# Patient Record
Sex: Female | Born: 1980 | Race: White | Hispanic: No | Marital: Married | Smoking: Former smoker
Health system: Southern US, Community
[De-identification: ages and names within clinical notes are randomized; demographics above are authoritative.]

## PROBLEM LIST (undated history)

## (undated) DIAGNOSIS — K760 Fatty (change of) liver, not elsewhere classified: Secondary | ICD-10-CM

## (undated) DIAGNOSIS — N200 Calculus of kidney: Secondary | ICD-10-CM

## (undated) DIAGNOSIS — U071 COVID-19: Secondary | ICD-10-CM

## (undated) DIAGNOSIS — K589 Irritable bowel syndrome without diarrhea: Secondary | ICD-10-CM

## (undated) HISTORY — PX: TONSILLECTOMY: SUR1361

## (undated) HISTORY — PX: INCONTINENCE SURGERY: SHX676

## (undated) HISTORY — PX: ABDOMINAL HYSTERECTOMY: SHX81

## (undated) HISTORY — PX: SHOULDER SURGERY: SHX246

## (undated) HISTORY — PX: COLONOSCOPY: SHX174

---

## 2008-08-17 ENCOUNTER — Encounter: Payer: Self-pay | Admitting: Pulmonary Disease

## 2008-08-20 ENCOUNTER — Emergency Department (HOSPITAL_BASED_OUTPATIENT_CLINIC_OR_DEPARTMENT_OTHER): Admission: EM | Admit: 2008-08-20 | Discharge: 2008-08-20 | Payer: Self-pay | Admitting: Emergency Medicine

## 2008-08-21 ENCOUNTER — Ambulatory Visit (HOSPITAL_BASED_OUTPATIENT_CLINIC_OR_DEPARTMENT_OTHER): Admission: RE | Admit: 2008-08-21 | Discharge: 2008-08-21 | Payer: Self-pay | Admitting: Family Medicine

## 2008-08-26 ENCOUNTER — Ambulatory Visit: Payer: Self-pay | Admitting: Pulmonary Disease

## 2008-08-26 DIAGNOSIS — R0602 Shortness of breath: Secondary | ICD-10-CM | POA: Insufficient documentation

## 2008-08-26 DIAGNOSIS — Z9189 Other specified personal risk factors, not elsewhere classified: Secondary | ICD-10-CM | POA: Insufficient documentation

## 2008-08-26 DIAGNOSIS — R079 Chest pain, unspecified: Secondary | ICD-10-CM | POA: Insufficient documentation

## 2011-07-06 LAB — BASIC METABOLIC PANEL
BUN: 14
CO2: 20
Creatinine, Ser: 0.8
Glucose, Bld: 101 — ABNORMAL HIGH
Potassium: 3.5
Sodium: 142

## 2011-07-06 LAB — CBC
MCHC: 34.3
MCV: 85.4
RDW: 11.6
WBC: 7.6

## 2015-07-19 ENCOUNTER — Encounter (HOSPITAL_BASED_OUTPATIENT_CLINIC_OR_DEPARTMENT_OTHER): Payer: Self-pay | Admitting: Emergency Medicine

## 2015-07-19 ENCOUNTER — Emergency Department (HOSPITAL_BASED_OUTPATIENT_CLINIC_OR_DEPARTMENT_OTHER): Payer: BLUE CROSS/BLUE SHIELD

## 2015-07-19 ENCOUNTER — Emergency Department (HOSPITAL_BASED_OUTPATIENT_CLINIC_OR_DEPARTMENT_OTHER)
Admission: EM | Admit: 2015-07-19 | Discharge: 2015-07-19 | Disposition: A | Discharge status: Home / Self Care | Payer: BLUE CROSS/BLUE SHIELD | Attending: Emergency Medicine | Admitting: Emergency Medicine

## 2015-07-19 DIAGNOSIS — R110 Nausea: Secondary | ICD-10-CM | POA: Insufficient documentation

## 2015-07-19 DIAGNOSIS — Z87442 Personal history of urinary calculi: Secondary | ICD-10-CM | POA: Insufficient documentation

## 2015-07-19 DIAGNOSIS — Z3202 Encounter for pregnancy test, result negative: Secondary | ICD-10-CM | POA: Diagnosis not present

## 2015-07-19 DIAGNOSIS — R109 Unspecified abdominal pain: Secondary | ICD-10-CM | POA: Diagnosis not present

## 2015-07-19 DIAGNOSIS — Z90710 Acquired absence of both cervix and uterus: Secondary | ICD-10-CM | POA: Insufficient documentation

## 2015-07-19 DIAGNOSIS — Z87891 Personal history of nicotine dependence: Secondary | ICD-10-CM | POA: Insufficient documentation

## 2015-07-19 DIAGNOSIS — R300 Dysuria: Secondary | ICD-10-CM | POA: Diagnosis not present

## 2015-07-19 HISTORY — DX: Calculus of kidney: N20.0

## 2015-07-19 LAB — BASIC METABOLIC PANEL
ANION GAP: 4 — AB (ref 5–15)
BUN: 13 mg/dL (ref 6–20)
CALCIUM: 8.9 mg/dL (ref 8.9–10.3)
CO2: 27 mmol/L (ref 22–32)
Chloride: 110 mmol/L (ref 101–111)
Creatinine, Ser: 0.93 mg/dL (ref 0.44–1.00)
GLUCOSE: 93 mg/dL (ref 65–99)
Potassium: 3.7 mmol/L (ref 3.5–5.1)
Sodium: 141 mmol/L (ref 135–145)

## 2015-07-19 LAB — CBC WITH DIFFERENTIAL/PLATELET
BASOS ABS: 0 10*3/uL (ref 0.0–0.1)
BASOS PCT: 0 %
EOS ABS: 0.2 10*3/uL (ref 0.0–0.7)
EOS PCT: 2 %
HCT: 39.1 % (ref 36.0–46.0)
Hemoglobin: 13.1 g/dL (ref 12.0–15.0)
Lymphocytes Relative: 42 %
Lymphs Abs: 3.2 10*3/uL (ref 0.7–4.0)
MCH: 28.7 pg (ref 26.0–34.0)
MCHC: 33.5 g/dL (ref 30.0–36.0)
MCV: 85.7 fL (ref 78.0–100.0)
MONO ABS: 0.7 10*3/uL (ref 0.1–1.0)
Monocytes Relative: 9 %
Neutro Abs: 3.6 10*3/uL (ref 1.7–7.7)
Neutrophils Relative %: 47 %
PLATELETS: 213 10*3/uL (ref 150–400)
RBC: 4.56 MIL/uL (ref 3.87–5.11)
RDW: 13.1 % (ref 11.5–15.5)
WBC: 7.6 10*3/uL (ref 4.0–10.5)

## 2015-07-19 LAB — URINALYSIS, ROUTINE W REFLEX MICROSCOPIC
BILIRUBIN URINE: NEGATIVE
GLUCOSE, UA: NEGATIVE mg/dL
HGB URINE DIPSTICK: NEGATIVE
KETONES UR: NEGATIVE mg/dL
LEUKOCYTES UA: NEGATIVE
Nitrite: NEGATIVE
PH: 7 (ref 5.0–8.0)
Protein, ur: NEGATIVE mg/dL
Specific Gravity, Urine: 1.004 — ABNORMAL LOW (ref 1.005–1.030)
Urobilinogen, UA: 0.2 mg/dL (ref 0.0–1.0)

## 2015-07-19 LAB — PREGNANCY, URINE: Preg Test, Ur: NEGATIVE

## 2015-07-19 MED ORDER — IBUPROFEN 800 MG PO TABS: 800.0000 mg | ORAL_TABLET | Freq: Three times a day (TID) | ORAL | Status: DC

## 2015-07-19 MED ORDER — ONDANSETRON HCL 4 MG/2ML IJ SOLN
4.0000 mg | Freq: Once | INTRAMUSCULAR | Status: AC
  Administered 2015-07-19: 4 mg via INTRAVENOUS
  Filled 2015-07-19: qty 2

## 2015-07-19 MED ORDER — HYDROCODONE-ACETAMINOPHEN 5-325 MG PO TABS: 1.0000 | ORAL_TABLET | ORAL | Status: DC | PRN

## 2015-07-19 MED ORDER — SODIUM CHLORIDE 0.9 % IV BOLUS (SEPSIS)
1000.0000 mL | Freq: Once | INTRAVENOUS | Status: AC
  Administered 2015-07-19: 1000 mL via INTRAVENOUS

## 2015-07-19 MED ORDER — DIAZEPAM 5 MG PO TABS
5.0000 mg | ORAL_TABLET | Freq: Once | ORAL | Status: AC
Start: 2015-07-19 — End: 2015-07-19
  Administered 2015-07-19: 5 mg via ORAL
  Filled 2015-07-19: qty 1

## 2015-07-19 MED ORDER — KETOROLAC TROMETHAMINE 30 MG/ML IJ SOLN
30.0000 mg | Freq: Once | INTRAMUSCULAR | Status: AC
  Administered 2015-07-19: 30 mg via INTRAVENOUS
  Filled 2015-07-19: qty 1

## 2015-07-19 MED ORDER — MORPHINE SULFATE (PF) 4 MG/ML IV SOLN
4.0000 mg | Freq: Once | INTRAVENOUS | Status: AC
  Administered 2015-07-19: 4 mg via INTRAVENOUS
  Filled 2015-07-19: qty 1

## 2015-07-19 NOTE — ED Provider Notes (Signed)
CSN: [id number]     Arrival date & time 07/19/15  1548 History  By signing my name below, I, Budd PalmerVanessa Prueter, attest that this documentation has been prepared under the direction and in the presence of Glynn OctaveStephen Casson Catena, MD. Electronically Signed: Budd PalmerVanessa Prueter, ED Scribe. 07/19/2015. 5:10 PM.     Chief Complaint  Patient presents with  . Flank Pain   The history is provided by the patient. No language interpreter was used.   HPI Comments: Kristin Jarvis is a 34 y.o. female former smoker with a PMHx of renal stones (2x) and a PSHx of hysterectoy who presents to the Emergency Department complaining of intermittent, sharp left flank pain radiating down the left leg onset 3 days ago. She reports associated dysuria, loss of appetite, and nausea. She states she has applied a heating pad with no relief. She reports exacerbation with sitting straight up. She states she was able to pass her 2 previous kidney stones. She denies recent back injuries and a PMHx of back problems. Pt denies hematuria, vomiting, fever, and abdominal pain.  Past Medical History  Diagnosis Date  . Renal stone    Past Surgical History  Procedure Laterality Date  . Abdominal hysterectomy    . Tonsillectomy    . Shoulder surgery    . Incontinence surgery     History reviewed. No pertinent family history. Social History  Substance Use Topics  . Smoking status: Former Games developermoker  . Smokeless tobacco: None  . Alcohol Use: Yes   OB History    No data available     Review of Systems A complete 10 system review of systems was obtained and all systems are negative except as noted in the HPI and PMH.   Allergies  Sulfonamide derivatives  Home Medications   Prior to Admission medications   Medication Sig Start Date End Date Taking? Authorizing Provider  HYDROcodone-acetaminophen (NORCO/VICODIN) 5-325 MG tablet Take 1 tablet by mouth every 4 (four) hours as needed. 07/19/15   Glynn OctaveStephen Saleema Weppler, MD  ibuprofen (ADVIL,MOTRIN)  800 MG tablet Take 1 tablet (800 mg total) by mouth 3 (three) times daily. 07/19/15   Glynn OctaveStephen Lowell Makara, MD   BP 98/68 mmHg  Pulse 63  Temp(Src) 98.2 F (36.8 C) (Oral)  Resp 18  Ht 6' (1.829 m)  Wt 197 lb (89.359 kg)  BMI 26.71 kg/m2  SpO2 100%  LMP  (Approximate) Physical Exam  Constitutional: She is oriented to person, place, and time. She appears well-developed and well-nourished. No distress.  HENT:  Head: Normocephalic and atraumatic.  Mouth/Throat: Oropharynx is clear and moist. No oropharyngeal exudate.  Eyes: Conjunctivae and EOM are normal. Pupils are equal, round, and reactive to light.  Neck: Normal range of motion. Neck supple.  No meningismus.  Cardiovascular: Normal rate, regular rhythm, normal heart sounds and intact distal pulses.   No murmur heard. Pulmonary/Chest: Effort normal and breath sounds normal. No respiratory distress.  Abdominal: Soft. There is no tenderness. There is no rebound and no guarding.  Musculoskeletal: Normal range of motion. She exhibits tenderness. She exhibits no edema.  Left CVA  Neurological: She is alert and oriented to person, place, and time. No cranial nerve deficit. She exhibits normal muscle tone. Coordination normal.  No ataxia on finger to nose bilaterally. No pronator drift. 5/5 strength throughout. CN 2-12 intact. Negative Romberg. Equal grip strength. Sensation intact. Gait is normal.   Skin: Skin is warm.  Psychiatric: She has a normal mood and affect. Her behavior is normal.  Nursing note and vitals reviewed.   ED Course  Procedures  DIAGNOSTIC STUDIES: Oxygen Saturation is 100% on RA, normal by my interpretation.    COORDINATION OF CARE: 4:09 PM - Discussed plans to order pain and anti-nausea medication as well as diagnostic studies and imaging. Pt advised of plan for treatment and pt agrees.  5:06 PM - Discussed CT scan results. Pt reports she is still having pain. She denies weakness or numbness in the left leg. She  denies any vaginal discharge or bleeding and denies a PMHx of STDs. Discussed possible muscular spasm. Pt advised of plan for treatment and pt agrees.  Labs Review Labs Reviewed  URINALYSIS, ROUTINE W REFLEX MICROSCOPIC (NOT AT [facility]) - Abnormal; Notable for the following:    Specific Gravity, Urine 1.004 (*)    All other components within normal limits  BASIC METABOLIC PANEL - Abnormal; Notable for the following:    Anion gap 4 (*)    All other components within normal limits  PREGNANCY, URINE  CBC WITH DIFFERENTIAL/PLATELET    Imaging Review Ct Renal Stone Study  07/19/2015  CLINICAL DATA:  Left flank pain for 4 days EXAM: CT ABDOMEN AND PELVIS WITHOUT CONTRAST TECHNIQUE: Multidetector CT imaging of the abdomen and pelvis was performed following the standard protocol without IV contrast. COMPARISON:  06/08/2012 FINDINGS: Lung bases are unremarkable. Sagittal images of the spine are unremarkable. Liver, pancreas, spleen and adrenal glands are unremarkable. Unenhanced kidneys are symmetrical in size. No hydronephrosis or hydroureter. Nonobstructive calculus in lower pole of the left kidney measures 2 mm. No calcified ureteral calculi. Bilateral distal ureter is unremarkable. No calcified calculi are noted within under distended urinary bladder. The uterus is surgically absent Moderate stool noted in right colon and transverse colon. No pericecal inflammation. Normal appendix is noted in axial image 65. There is a low lying cecum. No small bowel obstruction. No ascites or free air. No adenopathy. Moderate stool noted within rectum. The terminal ileum is unremarkable. No destructive bony lesions are noted within pelvis. The ovaries are unremarkable. IMPRESSION: 1. There is left nonobstructive nephrolithiasis. 2. No hydronephrosis or hydroureter. 3. No calcified ureteral calculi. 4. Surgically absent uterus. 5. Low lying cecum.  No pericecal inflammation.  Normal appendix. Electronically Signed   By:  Natasha Mead M.D.   On: 07/19/2015 16:33   I have personally reviewed and evaluated these images and lab results as part of my medical decision-making.   EKG Interpretation None      MDM   Final diagnoses:  Flank pain  L flank pain x 3 days similar to previous kidney stone.  Dysuria and nausea.  No fever.  UA negative.  CT shows no ureteral stones. There are nonobstructing stones in the left kidney. No other acute findings. Appendix is normal. Urinalysis is negative for infection and hematuria.  Informed patient is maybe passed kidney stone but could also be muscular skeletal back pain or spasm. She has intact strength and sensation without evidence of cord compression or cauda equina. No bowel or bladder incontinence.  She declines pelvic exam. We'll treat supportively for musculoskeletal back pain. Consider possible kidney stone as well with remaining ureteral spasm. Tolerating PO and ambulatory. Followup with PCP. Return precautions discussed.     I personally performed the services described in this documentation, which was scribed in my presence. The recorded information has been reviewed and is accurate.   Glynn Octave, MD 07/19/15 [id number]

## 2015-07-19 NOTE — ED Notes (Signed)
Pt givne water and diet coke for PO challenge

## 2015-07-19 NOTE — Discharge Instructions (Signed)
Flank Pain As discussed, you may have passed a kidney stone or have muscular pain in your back. Take the medications and followup with your doctor. Return to the ED if you develop new or worsening symptoms. Flank pain refers to pain that is located on the side of the body between the upper abdomen and the back. The pain may occur over a short period of time (acute) or may be long-term or reoccurring (chronic). It may be mild or severe. Flank pain can be caused by many things. CAUSES  Some of the more common causes of flank pain include:  Muscle strains.   Muscle spasms.   A disease of your spine (vertebral disk disease).   A lung infection (pneumonia).   Fluid around your lungs (pulmonary edema).   A kidney infection.   Kidney stones.   A very painful skin rash caused by the chickenpox virus (shingles).   Gallbladder disease.  HOME CARE INSTRUCTIONS  Home care will depend on the cause of your pain. In general,  Rest as directed by your caregiver.  Drink enough fluids to keep your urine clear or pale yellow.  Only take over-the-counter or prescription medicines as directed by your caregiver. Some medicines may help relieve the pain.  Tell your caregiver about any changes in your pain.  Follow up with your caregiver as directed. SEEK IMMEDIATE MEDICAL CARE IF:   Your pain is not controlled with medicine.   You have new or worsening symptoms.  Your pain increases.   You have abdominal pain.   You have shortness of breath.   You have persistent nausea or vomiting.   You have swelling in your abdomen.   You feel faint or pass out.   You have blood in your urine.  You have a fever or persistent symptoms for more than 2-3 days.  You have a fever and your symptoms suddenly get worse. MAKE SURE YOU:   Understand these instructions.  Will watch your condition.  Will get help right away if you are not doing well or get worse.   This information  is not intended to replace advice given to you by your health care provider. Make sure you discuss any questions you have with your health care provider.   Document Released: 11/10/2005 Document Revised: 06/13/2012 Document Reviewed: 05/03/2012 Elsevier Interactive Patient Education [employer]2016 Elsevier Inc.

## 2015-07-19 NOTE — ED Notes (Signed)
Pt in with L flank pain x 4 days, hx of stones. Dysuria.

## 2015-07-19 NOTE — ED Notes (Signed)
Patient transported to CT and returned 

## 2016-02-10 ENCOUNTER — Encounter (HOSPITAL_BASED_OUTPATIENT_CLINIC_OR_DEPARTMENT_OTHER): Payer: Self-pay | Admitting: Emergency Medicine

## 2016-02-10 ENCOUNTER — Emergency Department (HOSPITAL_BASED_OUTPATIENT_CLINIC_OR_DEPARTMENT_OTHER): Payer: BLUE CROSS/BLUE SHIELD

## 2016-02-10 ENCOUNTER — Emergency Department (HOSPITAL_BASED_OUTPATIENT_CLINIC_OR_DEPARTMENT_OTHER)
Admission: EM | Admit: 2016-02-10 | Discharge: 2016-02-10 | Disposition: A | Discharge status: Home / Self Care | Payer: BLUE CROSS/BLUE SHIELD | Attending: Emergency Medicine | Admitting: Emergency Medicine

## 2016-02-10 DIAGNOSIS — Z87891 Personal history of nicotine dependence: Secondary | ICD-10-CM | POA: Insufficient documentation

## 2016-02-10 DIAGNOSIS — R1084 Generalized abdominal pain: Secondary | ICD-10-CM | POA: Insufficient documentation

## 2016-02-10 DIAGNOSIS — R109 Unspecified abdominal pain: Secondary | ICD-10-CM | POA: Diagnosis present

## 2016-02-10 LAB — COMPREHENSIVE METABOLIC PANEL
ALBUMIN: 4.4 g/dL (ref 3.5–5.0)
ALT: 22 U/L (ref 14–54)
ANION GAP: 3 — AB (ref 5–15)
AST: 17 U/L (ref 15–41)
Alkaline Phosphatase: 49 U/L (ref 38–126)
BILIRUBIN TOTAL: 0.6 mg/dL (ref 0.3–1.2)
BUN: 12 mg/dL (ref 6–20)
CHLORIDE: 111 mmol/L (ref 101–111)
CO2: 22 mmol/L (ref 22–32)
Calcium: 8.7 mg/dL — ABNORMAL LOW (ref 8.9–10.3)
Creatinine, Ser: 0.92 mg/dL (ref 0.44–1.00)
GFR calc Af Amer: >60 mL/min (ref >60)
GFR calc non Af Amer: >60 mL/min (ref >60)
GLUCOSE: 93 mg/dL (ref 65–99)
POTASSIUM: 3.3 mmol/L — AB (ref 3.5–5.1)
SODIUM: 136 mmol/L (ref 135–145)
TOTAL PROTEIN: 6.9 g/dL (ref 6.5–8.1)

## 2016-02-10 LAB — CBC
HEMATOCRIT: 38.5 % (ref 36.0–46.0)
HEMOGLOBIN: 12.8 g/dL (ref 12.0–15.0)
MCH: 28.1 pg (ref 26.0–34.0)
MCHC: 33.2 g/dL (ref 30.0–36.0)
MCV: 84.6 fL (ref 78.0–100.0)
Platelets: 234 10*3/uL (ref 150–400)
RBC: 4.55 MIL/uL (ref 3.87–5.11)
RDW: 13.5 % (ref 11.5–15.5)
WBC: 8.9 10*3/uL (ref 4.0–10.5)

## 2016-02-10 LAB — URINALYSIS, ROUTINE W REFLEX MICROSCOPIC
Bilirubin Urine: NEGATIVE
GLUCOSE, UA: NEGATIVE mg/dL
Hgb urine dipstick: NEGATIVE
KETONES UR: NEGATIVE mg/dL
LEUKOCYTES UA: NEGATIVE
NITRITE: NEGATIVE
PROTEIN: NEGATIVE mg/dL
Specific Gravity, Urine: 1.004 — ABNORMAL LOW (ref 1.005–1.030)
pH: 6.5 (ref 5.0–8.0)

## 2016-02-10 LAB — LIPASE, BLOOD: LIPASE: 37 U/L (ref 11–51)

## 2016-02-10 MED ORDER — IOPAMIDOL (ISOVUE-300) INJECTION 61%
100.0000 mL | Freq: Once | INTRAVENOUS | Status: AC | PRN
  Administered 2016-02-10: 100 mL via INTRAVENOUS

## 2016-02-10 MED ORDER — SODIUM CHLORIDE 0.9 % IV SOLN
Freq: Once | INTRAVENOUS | Status: AC
  Administered 2016-02-10: 16:00:00 via INTRAVENOUS

## 2016-02-10 MED ORDER — ONDANSETRON HCL 4 MG/2ML IJ SOLN
4.0000 mg | Freq: Once | INTRAMUSCULAR | Status: AC
  Administered 2016-02-10: 4 mg via INTRAVENOUS
  Filled 2016-02-10: qty 2

## 2016-02-10 MED ORDER — FENTANYL CITRATE (PF) 100 MCG/2ML IJ SOLN
50.0000 ug | INTRAMUSCULAR | Status: AC | PRN
  Administered 2016-02-10 (×2): 50 ug via INTRAVENOUS
  Filled 2016-02-10 (×2): qty 2

## 2016-02-10 MED ORDER — MORPHINE SULFATE (PF) 4 MG/ML IV SOLN
4.0000 mg | INTRAVENOUS | Status: DC | PRN
  Administered 2016-02-10: 4 mg via INTRAVENOUS
  Filled 2016-02-10: qty 1

## 2016-02-10 MED ORDER — ONDANSETRON HCL 4 MG/2ML IJ SOLN
4.0000 mg | Freq: Once | INTRAMUSCULAR | Status: AC | PRN
  Administered 2016-02-10: 4 mg via INTRAVENOUS
  Filled 2016-02-10: qty 2

## 2016-02-10 NOTE — ED Notes (Signed)
Patient states that she is having generalized abdominal pain on and off since Monday. Went to Urgent care and now she is hurting continuously

## 2016-02-10 NOTE — ED Notes (Signed)
Pt states pain has returned, pain still at umbilicus area. Pain med repeated per orders, comfort measures provided, family member at bedside, pt cont on cont POX with int NBP

## 2016-02-10 NOTE — ED Notes (Signed)
[facility]

## 2016-02-10 NOTE — ED Notes (Signed)
Pt states she has had a hysterectomy, ovaries remain in place, states tubes removed with hysterectomy

## 2016-02-10 NOTE — ED Provider Notes (Signed)
CSN: [id number]     Arrival date & time 02/10/16  1357 History   First MD Initiated Contact with Patient 02/10/16 1522     Chief Complaint  Patient presents with  . Abdominal Pain     (Consider location/radiation/quality/duration/timing/severity/associated sxs/prior Treatment) HPI 35 year old female who presents with abdominal pain. History of kidney stones, TAH, and bladder sling x 2. Ongoing since 4 days ago, initially started as low abdominal cramping. Initially coming and going, but now constant. Comes in waves, and seems "contraction like." Lots of nausea, no vomiting. Tolerating PO intake. Painful bowel movement yesterday. Small bowel movement, which is unusual for her. No dysuria, hematuria, or frequency of urine. Associated with feeling tight in her upper abdomen. No fever, but having chills.    Past Medical History  Diagnosis Date  . Renal stone    Past Surgical History  Procedure Laterality Date  . Abdominal hysterectomy    . Tonsillectomy    . Shoulder surgery    . Incontinence surgery     History reviewed. No pertinent family history. Social History  Substance Use Topics  . Smoking status: Former Games developer  . Smokeless tobacco: None  . Alcohol Use: Yes   OB History    No data available     Review of Systems 10/14 systems reviewed and are negative other than those stated in the HPI    Allergies  Sulfonamide derivatives  Home Medications   Prior to Admission medications   Medication Sig Start Date End Date Taking? Authorizing Provider  HYDROcodone-acetaminophen (NORCO/VICODIN) 5-325 MG tablet Take 1 tablet by mouth every 4 (four) hours as needed. 07/19/15   Glynn Octave, MD  ibuprofen (ADVIL,MOTRIN) 800 MG tablet Take 1 tablet (800 mg total) by mouth 3 (three) times daily. 07/19/15   Glynn Octave, MD   BP 110/71 mmHg  Pulse 72  Temp(Src) 98.2 F (36.8 C) (Oral)  Resp 18  Ht 6' (1.829 m)  Wt 210 lb (95.255 kg)  BMI 28.47 kg/m2  SpO2 100%  LMP   (Approximate) Physical Exam Physical Exam  Nursing note and vitals reviewed. Constitutional: Well developed, well nourished, non-toxic, and in no acute distress Head: Normocephalic and atraumatic.  Mouth/Throat: Oropharynx is clear and moist.  Neck: Normal range of motion. Neck supple.  Cardiovascular: Normal rate and regular rhythm.   Pulmonary/Chest: Effort normal and breath sounds normal.  Abdominal: Soft. Mild distension. No CVA tenderness. There is epigastric and periumbilical tenderness. There is no rebound and no guarding.  Musculoskeletal: Normal range of motion.  Neurological: Alert, no facial droop, fluent speech, moves all extremities symmetrically Skin: Skin is warm and dry.  Psychiatric: Cooperative  ED Course  Procedures (including critical care time) Labs Review Labs Reviewed  URINALYSIS, ROUTINE W REFLEX MICROSCOPIC (NOT AT [facility]) - Abnormal; Notable for the following:    Specific Gravity, Urine 1.004 (*)    All other components within normal limits  COMPREHENSIVE METABOLIC PANEL - Abnormal; Notable for the following:    Potassium 3.3 (*)    Calcium 8.7 (*)    Anion gap 3 (*)    All other components within normal limits  LIPASE, BLOOD  CBC  I-STAT BETA HCG BLOOD, ED ([facility], [facility], [facility] ONLY)    Imaging Review Ct Abdomen Pelvis W Contrast  02/10/2016  CLINICAL DATA:  Periumbilical abdominal pain for 3 days with nausea and constipation, history of bladder sling EXAM: CT ABDOMEN AND PELVIS WITH CONTRAST TECHNIQUE: Multidetector CT imaging of the abdomen and pelvis  was performed using the standard protocol following bolus administration of intravenous contrast. CONTRAST:  100mL ISOVUE-300 IOPAMIDOL (ISOVUE-300) INJECTION 61% COMPARISON:  07/19/2015 FINDINGS: Lower chest:  Visualized portions of the lung bases are clear Hepatobiliary: The liver and gallbladder are normal Pancreas: Normal Spleen: Normal Adrenals/Urinary Tract: Adrenal glands are normal. Left kidney is normal with  no hydronephrosis. 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 4 mm low-attenuation rounded lesion upper pole right kidney too small to characterize but possibly a tiny cyst. No hydronephrosis. Stomach/Bowel: There is a nonobstructive bowel gas pattern. Large bowel is normal. Appendix is normal. Small bowel and stomach are normal. Vascular/Lymphatic: No significant vascular abnormalities. Small nonpathologic mesenteric lymph nodes are present. Caps Reproductive: The uterus appears to be surgically absent. There is a surgical clip in the midline posterior to the bladder which was present previously. Ovaries appear mobile lines. Hypo attenuating 2.7 cm left ovarian oval structure likely a cyst or follicle. Right ovary shows no significant findings. Other: No free fluid Musculoskeletal: No acute musculoskeletal findings. IMPRESSION: No significant or acute findings to account for the patient's symptoms. Indeterminate 4 mm right renal lesion too small to characterize possibly a tiny cyst. 2.7 cm left ovarian lesion likely an ovarian cyst or follicle. Electronically Signed   By: Esperanza Heiraymond  Rubner M.D.   On: 02/10/2016 17:30   I have personally reviewed and evaluated these images and lab results as part of my medical decision-making.   EKG Interpretation None      MDM   Final diagnoses:  Generalized abdominal pain    35 year old female who presents with abdominal pain. She is well-appearing, and in no acute distress. Vital signs within normal limits. Her abdomen is soft and benign. With generalized tenderness to palpation. Basic blood work including CBC, comp and metabolic panel, lipase, and urinalysis are unremarkable. CT abdomen pelvis shows no acute intra-abdominal process. At this time I am not suspecting serious or toxic intra-abdominal process. Given her painful and hard bowel movements yesterday, constipation may be likely etiology of her symptoms. Started on bowel regimen.  Strict return and follow-up instructions are reviewed. She expressed understanding of all discharge instructions, and felt comfortable to plan of care.    Lavera Guiseana Duo Liu, MD 02/10/16 [phone]

## 2016-02-10 NOTE — ED Notes (Signed)
Pt tolerated PO contrast.

## 2016-02-10 NOTE — Discharge Instructions (Signed)
Your CT scan does not show any serious cause of your abdominal pain today in your blood work overall is unremarkable. start taking a bowel regimen, such as MiraLAX once daily.  you may also add Colace. Return for worsening symptoms, including worsening pain, vomiting unable to keep down food or fluids, fevers, or any other symptoms concerning to you.   Abdominal Pain, Adult Many things can cause abdominal pain. Usually, abdominal pain is not caused by a disease and will improve without treatment. It can often be observed and treated at home. Your health care provider will do a physical exam and possibly order blood tests and X-rays to help determine the seriousness of your pain. However, in many cases, more time must pass before a clear cause of the pain can be found. Before that point, your health care provider may not know if you need more testing or further treatment. HOME CARE INSTRUCTIONS Monitor your abdominal pain for any changes. The following actions may help to alleviate any discomfort you are experiencing:  Only take over-the-counter or prescription medicines as directed by your health care provider.  Do not take laxatives unless directed to do so by your health care provider.  Try a clear liquid diet (broth, tea, or water) as directed by your health care provider. Slowly move to a bland diet as tolerated. SEEK MEDICAL CARE IF:  You have unexplained abdominal pain.  You have abdominal pain associated with nausea or diarrhea.  You have pain when you urinate or have a bowel movement.  You experience abdominal pain that wakes you in the night.  You have abdominal pain that is worsened or improved by eating food.  You have abdominal pain that is worsened with eating fatty foods.  You have a fever. SEEK IMMEDIATE MEDICAL CARE IF:  Your pain does not go away within 2 hours.  You keep throwing up (vomiting).  Your pain is felt only in portions of the abdomen, such as the right  side or the left lower portion of the abdomen.  You pass bloody or black tarry stools. MAKE SURE YOU:  Understand these instructions.  Will watch your condition.  Will get help right away if you are not doing well or get worse.   This information is not intended to replace advice given to you by your health care provider. Make sure you discuss any questions you have with your health care provider.   Document Released: 06/29/2005 Document Revised: 06/10/2015 Document Reviewed: 05/29/2013 Elsevier Interactive Patient Education [employer] Else[employer] Inc.

## 2016-04-09 ENCOUNTER — Emergency Department (HOSPITAL_BASED_OUTPATIENT_CLINIC_OR_DEPARTMENT_OTHER)
Admission: EM | Admit: 2016-04-09 | Discharge: 2016-04-09 | Disposition: A | Discharge status: Home / Self Care | Payer: BLUE CROSS/BLUE SHIELD | Attending: Emergency Medicine | Admitting: Emergency Medicine

## 2016-04-09 ENCOUNTER — Encounter (HOSPITAL_BASED_OUTPATIENT_CLINIC_OR_DEPARTMENT_OTHER): Payer: Self-pay | Admitting: Emergency Medicine

## 2016-04-09 ENCOUNTER — Emergency Department (HOSPITAL_BASED_OUTPATIENT_CLINIC_OR_DEPARTMENT_OTHER): Payer: BLUE CROSS/BLUE SHIELD

## 2016-04-09 DIAGNOSIS — R300 Dysuria: Secondary | ICD-10-CM

## 2016-04-09 DIAGNOSIS — R109 Unspecified abdominal pain: Secondary | ICD-10-CM | POA: Diagnosis present

## 2016-04-09 DIAGNOSIS — Z87891 Personal history of nicotine dependence: Secondary | ICD-10-CM | POA: Diagnosis not present

## 2016-04-09 DIAGNOSIS — N200 Calculus of kidney: Secondary | ICD-10-CM | POA: Diagnosis not present

## 2016-04-09 LAB — BASIC METABOLIC PANEL
Anion gap: 8 (ref 5–15)
BUN: 13 mg/dL (ref 6–20)
CHLORIDE: 107 mmol/L (ref 101–111)
CO2: 24 mmol/L (ref 22–32)
Calcium: 8.8 mg/dL — ABNORMAL LOW (ref 8.9–10.3)
Creatinine, Ser: 0.91 mg/dL (ref 0.44–1.00)
GFR calc non Af Amer: >60 mL/min (ref >60)
Glucose, Bld: 85 mg/dL (ref 65–99)
POTASSIUM: 3.6 mmol/L (ref 3.5–5.1)
SODIUM: 139 mmol/L (ref 135–145)

## 2016-04-09 LAB — CBC WITH DIFFERENTIAL/PLATELET
BASOS ABS: 0 10*3/uL (ref 0.0–0.1)
BASOS PCT: 0 %
EOS PCT: 2 %
Eosinophils Absolute: 0.2 10*3/uL (ref 0.0–0.7)
HCT: 39.5 % (ref 36.0–46.0)
Hemoglobin: 13.1 g/dL (ref 12.0–15.0)
Lymphocytes Relative: 9 %
Lymphs Abs: 0.7 10*3/uL (ref 0.7–4.0)
MCH: 27.9 pg (ref 26.0–34.0)
MCHC: 33.2 g/dL (ref 30.0–36.0)
MCV: 84 fL (ref 78.0–100.0)
Monocytes Absolute: 0.4 10*3/uL (ref 0.1–1.0)
Monocytes Relative: 6 %
Neutro Abs: 6.6 10*3/uL (ref 1.7–7.7)
Neutrophils Relative %: 83 %
PLATELETS: 214 10*3/uL (ref 150–400)
RBC: 4.7 MIL/uL (ref 3.87–5.11)
RDW: 14.7 % (ref 11.5–15.5)
WBC: 7.9 10*3/uL (ref 4.0–10.5)

## 2016-04-09 LAB — URINALYSIS, ROUTINE W REFLEX MICROSCOPIC
Bilirubin Urine: NEGATIVE
GLUCOSE, UA: NEGATIVE mg/dL
HGB URINE DIPSTICK: NEGATIVE
KETONES UR: NEGATIVE mg/dL
LEUKOCYTES UA: NEGATIVE
Nitrite: NEGATIVE
PROTEIN: NEGATIVE mg/dL
Specific Gravity, Urine: 1.009 (ref 1.005–1.030)
pH: 8 (ref 5.0–8.0)

## 2016-04-09 MED ORDER — IOPAMIDOL (ISOVUE-300) INJECTION 61%
100.0000 mL | Freq: Once | INTRAVENOUS | Status: AC | PRN
  Administered 2016-04-09: 100 mL via INTRAVENOUS

## 2016-04-09 MED ORDER — HYDROMORPHONE HCL 1 MG/ML IJ SOLN
0.5000 mg | Freq: Once | INTRAMUSCULAR | Status: AC
  Administered 2016-04-09: 0.5 mg via INTRAVENOUS
  Filled 2016-04-09: qty 1

## 2016-04-09 MED ORDER — FENTANYL CITRATE (PF) 100 MCG/2ML IJ SOLN
50.0000 ug | INTRAMUSCULAR | Status: DC | PRN
  Administered 2016-04-09: 50 ug via INTRAVENOUS
  Filled 2016-04-09: qty 2

## 2016-04-09 MED ORDER — HYDROMORPHONE HCL 1 MG/ML IJ SOLN
1.0000 mg | Freq: Once | INTRAMUSCULAR | Status: AC
  Administered 2016-04-09: 1 mg via INTRAVENOUS
  Filled 2016-04-09: qty 1

## 2016-04-09 MED ORDER — ONDANSETRON HCL 4 MG PO TABS: 4.0000 mg | ORAL_TABLET | Freq: Four times a day (QID) | ORAL | Status: DC

## 2016-04-09 MED ORDER — ONDANSETRON HCL 4 MG/2ML IJ SOLN
4.0000 mg | Freq: Once | INTRAMUSCULAR | Status: AC
  Administered 2016-04-09: 4 mg via INTRAVENOUS
  Filled 2016-04-09: qty 2

## 2016-04-09 MED ORDER — KETOROLAC TROMETHAMINE 30 MG/ML IJ SOLN
30.0000 mg | Freq: Once | INTRAMUSCULAR | Status: AC
  Administered 2016-04-09: 30 mg via INTRAVENOUS
  Filled 2016-04-09: qty 1

## 2016-04-09 MED ORDER — HYDROCODONE-ACETAMINOPHEN 5-325 MG PO TABS: 1.0000 | ORAL_TABLET | Freq: Four times a day (QID) | ORAL | Status: DC | PRN

## 2016-04-09 NOTE — ED Provider Notes (Signed)
CSN: [id number]     Arrival date & time 04/09/16  1412 History   First MD Initiated Contact with Patient 04/09/16 1438     Chief Complaint  Patient presents with  . Flank Pain   (Consider location/radiation/quality/duration/timing/severity/associated sxs/prior Treatment) HPI Comments: Patient with history of kidney stones, ovarian cysts, hysterectomy -- presents with complaint of left flank pain starting last night. Pain has been waxing and waning in nature and has been severe at times. Patient has had associated nausea but no vomiting. Patient has had dysuria and was seen at outside urgent care last night and started on Macrobid, although her urine was reportedly normal. Culture is pending. She denies change in her stools or bowel movements. No other treatments prior to arrival. Patient was seen several months ago her gynecologist for left pelvis pain. Patient states this pain is different and is more like previous kidney stone. Previous imaging studies performed here shows intrarenal stones, no ureteral stones.  Patient is a 35 y.o. female presenting with flank pain. The history is provided by the patient and medical records.  Flank Pain Associated symptoms include nausea. Pertinent negatives include no abdominal pain, chest pain, coughing, fever, headaches, myalgias, rash, sore throat or vomiting.    Past Medical History  Diagnosis Date  . Renal stone    Past Surgical History  Procedure Laterality Date  . Abdominal hysterectomy    . Tonsillectomy    . Shoulder surgery    . Incontinence surgery     History reviewed. No pertinent family history. Social History  Substance Use Topics  . Smoking status: Former Games developermoker  . Smokeless tobacco: None  . Alcohol Use: Yes   OB History    No data available     Review of Systems  Constitutional: Negative for fever.  HENT: Negative for rhinorrhea and sore throat.   Eyes: Negative for redness.  Respiratory: Negative for cough.    Cardiovascular: Negative for chest pain.  Gastrointestinal: Positive for nausea. Negative for vomiting, abdominal pain and diarrhea.  Genitourinary: Positive for dysuria and flank pain. Negative for hematuria, vaginal bleeding and vaginal discharge.  Musculoskeletal: Negative for myalgias.  Skin: Negative for rash.  Neurological: Negative for headaches.    Allergies  Sulfonamide derivatives  Home Medications   Prior to Admission medications   Medication Sig Start Date End Date Taking? Authorizing Provider  HYDROcodone-acetaminophen (NORCO/VICODIN) 5-325 MG tablet Take 1 tablet by mouth every 4 (four) hours as needed. 07/19/15   Glynn OctaveStephen Rancour, MD  ibuprofen (ADVIL,MOTRIN) 800 MG tablet Take 1 tablet (800 mg total) by mouth 3 (three) times daily. 07/19/15   Glynn OctaveStephen Rancour, MD   BP 118/81 mmHg  Pulse 71  Temp(Src) 98.7 F (37.1 C) (Oral)  Resp 18  Ht 6' (1.829 m)  Wt 95.255 kg  BMI 28.47 kg/m2  SpO2 97%  LMP  (Approximate)   Physical Exam  Constitutional: She appears well-developed and well-nourished.  HENT:  Head: Normocephalic and atraumatic.  Mouth/Throat: Oropharynx is clear and moist.  Eyes: Conjunctivae are normal. Right eye exhibits no discharge. Left eye exhibits no discharge.  Neck: Normal range of motion. Neck supple.  Cardiovascular: Normal rate, regular rhythm and normal heart sounds.   No murmur heard. Pulmonary/Chest: Effort normal and breath sounds normal. No respiratory distress. She has no wheezes. She has no rales.  Abdominal: Soft. There is no tenderness. There is CVA tenderness. There is no rebound and no guarding.  Neurological: She is alert.  Skin: Skin is warm and  dry.  Psychiatric: She has a normal mood and affect.  Nursing note and vitals reviewed.   ED Course  Procedures (including critical care time) Labs Review Labs Reviewed  BASIC METABOLIC PANEL - Abnormal; Notable for the following:    Calcium 8.8 (*)    All other components within  normal limits  URINALYSIS, ROUTINE W REFLEX MICROSCOPIC (NOT AT [facility])  CBC WITH DIFFERENTIAL/PLATELET    Imaging Review US Renal  04/09/2016  CLINICAL DATA:  Left flank pain.  History of nephrolithiasis. EXAM: RENAL / URINARY TRACT ULTRASOUND COMPLETE COMPARISON:  02/10/2016 CT abdomen/pelvis. FINDINGS: Right Kidney: Length: 12.8 cm. Echogenicity within normal limits. No mass, stones or hydronephrosis visualized. Left Kidney: Length: 12.7 cm. Echogenicity within normal limits. No mass, stones or hydronephrosis visualized. Bladder: Appears normal for degree of bladder distention. Bilateral ureteral jets are seen in the bladder lumen. IMPRESSION: 1. No evidence of urinary tract obstruction. No sonographic evidence of nephrolithiasis. 2. Normal bladder. Electronically Signed   By: Delbert Phenix M.D.   On: 04/09/2016 16:19   I have personally reviewed and evaluated these images and lab results as part of my medical decision-making.   Patient seen and examined. Work-up initiated. Medications ordered.   Vital signs reviewed and are as follows: BP 118/81 mmHg  Pulse 71  Temp(Src) 98.7 F (37.1 C) (Oral)  Resp 18  Ht 6' (1.829 m)  Wt 95.255 kg  BMI 28.47 kg/m2  SpO2 97%  LMP  (Approximate)  4:25 PM Urine tests, ultrasound, labs are all normal. Patient continues to be in pain.  4:38 PM patient states she is having severe left sided flank pain again. At this point, will proceed with CT imaging of the abdomen to rule out emergent or acute process.  Handoff to Laisure PA-C at shift change.   MDM   Final diagnoses:  Flank pain  Dysuria   Pending CT abd/pelvis.   Renne Crigler, PA-C 04/09/16 1640  Alvira Monday, MD 04/09/16 2033

## 2016-04-09 NOTE — ED Provider Notes (Signed)
5:00 PM Patient signed out to me by Rhea BleacherJosh Geiple, PA-C.  Patient presents today with left flank pain onset last evening.  No acute findings on renal ultrasound.  Patient's pain became significantly worse when she was reassessed by Emmit AlexandersGeiple, PA-C.  Therefore, CT ab/pelvis was ordered.  Plan is to discharge patient home if CT is negative.   5:48 PM Reassessed patient.  She is comfortable at this time.  Pain is controlled.  Discussed CT results with the patient.  UA negative for infection.  IMPRESSION: 1. Potential tiny 1 mm calculus in the distal LEFT ureter approximately 7 mm from the vascular junction. 2. Normal appendix. 3. No obstructive uropathy.  Patient will follow up with her Urologist.  Stable for discharge.  Return precautions given.    Santiago GladHeather Jaydan Meidinger, PA-C 04/10/16 [id number]  Doug SouSam Jacubowitz, MD 04/10/16 1459

## 2016-04-09 NOTE — ED Notes (Signed)
Pt in c/o frequency yesterday and L flank pain last night. Hx of kidney stones, feels the same.

## 2016-04-09 NOTE — ED Notes (Addendum)
Pt reports having urinary frequency and burning which started yesterday. [facility] and began tx with macrobid for UTI. Pt states L side back pain radiating to groin started late last night. Also reports nausea but denies vomiting. Pt states hx of multiple kidney stones. Also reports recent dx of cyst to L ovary. Denies blood in urine.

## 2016-04-10 ENCOUNTER — Emergency Department (HOSPITAL_BASED_OUTPATIENT_CLINIC_OR_DEPARTMENT_OTHER)
Admission: EM | Admit: 2016-04-10 | Discharge: 2016-04-10 | Disposition: A | Discharge status: Home / Self Care | Payer: BLUE CROSS/BLUE SHIELD | Attending: Emergency Medicine | Admitting: Emergency Medicine

## 2016-04-10 ENCOUNTER — Encounter (HOSPITAL_BASED_OUTPATIENT_CLINIC_OR_DEPARTMENT_OTHER): Payer: Self-pay | Admitting: Emergency Medicine

## 2016-04-10 DIAGNOSIS — Z87891 Personal history of nicotine dependence: Secondary | ICD-10-CM | POA: Insufficient documentation

## 2016-04-10 DIAGNOSIS — R509 Fever, unspecified: Secondary | ICD-10-CM | POA: Diagnosis present

## 2016-04-10 DIAGNOSIS — N23 Unspecified renal colic: Secondary | ICD-10-CM | POA: Diagnosis not present

## 2016-04-10 LAB — URINALYSIS, ROUTINE W REFLEX MICROSCOPIC
Bilirubin Urine: NEGATIVE
GLUCOSE, UA: NEGATIVE mg/dL
HGB URINE DIPSTICK: NEGATIVE
KETONES UR: NEGATIVE mg/dL
LEUKOCYTES UA: NEGATIVE
Nitrite: NEGATIVE
PH: 7 (ref 5.0–8.0)
Protein, ur: NEGATIVE mg/dL
Specific Gravity, Urine: 1.004 — ABNORMAL LOW (ref 1.005–1.030)

## 2016-04-10 MED ORDER — CIPROFLOXACIN HCL 500 MG PO TABS: 500.0000 mg | ORAL_TABLET | Freq: Two times a day (BID) | ORAL | Status: DC

## 2016-04-10 NOTE — ED Notes (Signed)
Pt was seen here yesterday for kidney stone and possible UTI. Negative for UTI, but spiked fever last night up to 102. Called urologist and was told to come for reevaluation. Ambulatory in NAD.

## 2016-04-10 NOTE — Discharge Instructions (Signed)
Please read and follow all provided instructions.  Your diagnoses today include:  1. Renal colic on left side     Tests performed today include:  Urine test that showed no blood in your urine and no infection  Vital signs. See below for your results today.   Medications prescribed:   Ciprofloxacin - antibiotic  You have been prescribed an antibiotic medicine: take the entire course of medicine even if you are feeling better. Stopping early can cause the antibiotic not to work.  Take any prescribed medications only as directed.  Home care instructions:  Follow any educational materials contained in this packet.  Please double your fluid intake for the next several days. Strain your urine and save any stones that may pass.   BE VERY CAREFUL not to take multiple medicines containing Tylenol (also called acetaminophen). Doing so can lead to an overdose which can damage your liver and cause liver failure and possibly death.   Follow-up instructions: Please follow-up with your urologist in the next several days for further evaluation of your symptoms.  Return instructions:   Please return to the Emergency Department if you experience worsening symptoms.  Please return if you develop fever or uncontrolled pain or vomiting.  Please return if you have any other emergent concerns.  Additional Information:  Your vital signs today were: BP 103/68 mmHg   Pulse 89   Temp(Src) 99.5 F (37.5 C) (Oral)   Resp 18   Ht 6' (1.829 m)   Wt 95.255 kg   BMI 28.47 kg/m2   SpO2 98%   LMP  (Approximate) If your blood pressure (BP) was elevated above 135/85 this visit, please have this repeated by your doctor within one month. --------------

## 2016-04-10 NOTE — ED Notes (Signed)
Josh, PA-C in room with patient now. 

## 2016-04-10 NOTE — ED Provider Notes (Signed)
CSN: [id number]     Arrival date & time 04/10/16  1353 History   First MD Initiated Contact with Patient 04/10/16 1416     Chief Complaint  Patient presents with  . Fever     (Consider location/radiation/quality/duration/timing/severity/associated sxs/prior Treatment) HPI Comments: Patient presents with complaint of fever in the setting of kidney stone. Patient was seen yesterday by myself. At that time she was diagnosed with a 1 mm left-sided ureteral stone on CT scan. Labs were normal. UA was normal. Patient was sent home with pain medication. She was previously on Macrobid for UTI symptoms. Patient states that upon returning home last night she developed a fever and chills that were intermittent to 102F. She took Tylenol with improvement. Patient [facility] earlier today but left due to waiting time. She presents here at the instruction of her urologist for recheck. Patient's pain is controlled with Vicodin at home. Flank pain and dysuria continue. The onset of this condition was acute. The course is constant. Aggravating factors: none. Alleviating factors: none.    The history is provided by the patient and medical records.     Past Medical History  Diagnosis Date  . Renal stone    Past Surgical History  Procedure Laterality Date  . Abdominal hysterectomy    . Tonsillectomy    . Shoulder surgery    . Incontinence surgery     History reviewed. No pertinent family history. Social History  Substance Use Topics  . Smoking status: Former Games developermoker  . Smokeless tobacco: None  . Alcohol Use: Yes   OB History    No data available     Review of Systems  Constitutional: Positive for fever and chills.  HENT: Negative for rhinorrhea and sore throat.   Eyes: Negative for redness.  Respiratory: Negative for cough.   Cardiovascular: Negative for chest pain.  Gastrointestinal: Positive for nausea. Negative for vomiting, abdominal pain and diarrhea.  Genitourinary:  Positive for dysuria and flank pain.  Musculoskeletal: Negative for myalgias.  Skin: Negative for rash.  Neurological: Negative for headaches.      Allergies  Sulfonamide derivatives  Home Medications   Prior to Admission medications   Medication Sig Start Date End Date Taking? Authorizing Provider  HYDROcodone-acetaminophen (NORCO/VICODIN) 5-325 MG tablet Take 1-2 tablets by mouth every 6 (six) hours as needed. 04/09/16   Heather Laisure, PA-C  ibuprofen (ADVIL,MOTRIN) 800 MG tablet Take 1 tablet (800 mg total) by mouth 3 (three) times daily. 07/19/15   Glynn OctaveStephen Rancour, MD  ondansetron (ZOFRAN) 4 MG tablet Take 1 tablet (4 mg total) by mouth every 6 (six) hours. 04/09/16   Heather Laisure, PA-C   BP 103/68 mmHg  Pulse 89  Temp(Src) 99.5 F (37.5 C) (Oral)  Resp 18  Ht 6' (1.829 m)  Wt 95.255 kg  BMI 28.47 kg/m2  SpO2 98%  LMP  (Approximate)   Physical Exam  Constitutional: She appears well-developed and well-nourished.  HENT:  Head: Normocephalic and atraumatic.  Eyes: Conjunctivae are normal.  Neck: Normal range of motion. Neck supple.  Cardiovascular: Normal rate.   Pulmonary/Chest: No respiratory distress.  Abdominal: Soft. Bowel sounds are normal. There is tenderness (Mild suprapubic). There is no rebound and no guarding.  Neurological: She is alert.  Skin: Skin is warm and dry.  Psychiatric: She has a normal mood and affect.  Nursing note and vitals reviewed.   ED Course  Procedures (including critical care time) Labs Review Labs Reviewed  URINALYSIS, ROUTINE W  REFLEX MICROSCOPIC (NOT AT [facility]) - Abnormal; Notable for the following:    Specific Gravity, Urine 1.004 (*)    All other components within normal limits    Imaging Review Ct Abdomen Pelvis W Contrast  04/09/2016  CLINICAL DATA:  LEFT flank pain for 12 hours. Waxing and waning pain. Nausea without vomiting. EXAM: CT ABDOMEN AND PELVIS WITH CONTRAST TECHNIQUE: Multidetector CT imaging of the abdomen and  pelvis was performed using the standard protocol following bolus administration of intravenous contrast. CONTRAST:  ISOVUE-300 IOPAMIDOL (ISOVUE-300) INJECTION 61% COMPARISON:  CT 05/10/ 2017 FINDINGS: Lower chest: Lung bases are clear. Hepatobiliary: No focal hepatic lesion. No biliary duct dilatation. Gallbladder is normal. Common bile duct is normal. Pancreas: Pancreas is normal. No ductal dilatation. No pancreatic inflammation. Spleen: Normal spleen Adrenals/urinary tract: Adrenal glands normal. No nephrolithiasis. No hydroureter. Within the course of the LEFT ureter there is a 1 mm calculus (image 84, series 2) which is approximately 7 mm from the vascular junction. There is no hydroureter or ureteral inflammation. Difficult itself this is a vascular calcification or ureteral calcification. Stomach/Bowel: Stomach, small bowel, appendix, and cecum are normal. The colon and rectosigmoid colon are normal. Vascular/Lymphatic: Abdominal aorta is normal caliber. There is no retroperitoneal or periportal lymphadenopathy. No pelvic lymphadenopathy. Reproductive: Post hysterectomy.  Ovaries are normal. Other: No free fluid. Musculoskeletal: No aggressive osseous lesion. IMPRESSION: 1. Potential tiny 1 mm calculus in the distal LEFT ureter approximately 7 mm from the vascular junction. 2. Normal appendix. 3. No obstructive uropathy. Electronically Signed   By: Genevive Bi M.D.   On: 04/09/2016 17:24   US Renal  04/09/2016  CLINICAL DATA:  Left flank pain.  History of nephrolithiasis. EXAM: RENAL / URINARY TRACT ULTRASOUND COMPLETE COMPARISON:  02/10/2016 CT abdomen/pelvis. FINDINGS: Right Kidney: Length: 12.8 cm. Echogenicity within normal limits. No mass, stones or hydronephrosis visualized. Left Kidney: Length: 12.7 cm. Echogenicity within normal limits. No mass, stones or hydronephrosis visualized. Bladder: Appears normal for degree of bladder distention. Bilateral ureteral jets are seen in the bladder  lumen. IMPRESSION: 1. No evidence of urinary tract obstruction. No sonographic evidence of nephrolithiasis. 2. Normal bladder. Electronically Signed   By: Delbert Phenix M.D.   On: 04/09/2016 16:19   I have personally reviewed and evaluated these images and lab results as part of my medical decision-making.  2:48 PM Patient seen and examined. UA is negative. Will change antibiotic from Macrobid to Cipro. Encouraged urology follow-up in the next week. Return to the emergency department with worsening symptoms or other concerns. Patient verbalizes understanding and agrees to plan. She will continue her home medications.  Vital signs reviewed and are as follows: BP 103/68 mmHg  Pulse 89  Temp(Src) 99.5 F (37.5 C) (Oral)  Resp 18  Ht 6' (1.829 m)  Wt 95.255 kg  BMI 28.47 kg/m2  SpO2 98%  LMP  (Approximate)    MDM   Final diagnoses:  Renal colic on left side   Patient with continued renal colic and irritative urinary symptoms. CT demonstrated a possible small left ureteral stone yesterday. Patient developed a fever last night. Her urine continues to be clean without white blood cells, leukocyte esterase, nitrites or bacteria noted. Patient's vital signs are stable here. Slightly increased temperature at 99.37F. Patient appears well, nontoxic. She had no evidence of pyelonephritis yesterday on labs or on CT scan. I'm unclear as to the etiology of the fever as she denies other symptoms, however it is not likely urologic in  nature. Despite this, given known kidney stone and irritative UTI symptoms, will broaden coverage from Macrobid to ciprofloxacin and have the patient follow up closely with her urologist.  Renne Crigler, PA-C 04/10/16 1532  Alvira Monday, MD 04/10/16 2216

## 2017-07-09 ENCOUNTER — Emergency Department (HOSPITAL_BASED_OUTPATIENT_CLINIC_OR_DEPARTMENT_OTHER): Payer: BLUE CROSS/BLUE SHIELD

## 2017-07-09 ENCOUNTER — Encounter (HOSPITAL_BASED_OUTPATIENT_CLINIC_OR_DEPARTMENT_OTHER): Payer: Self-pay | Admitting: Emergency Medicine

## 2017-07-09 ENCOUNTER — Emergency Department (HOSPITAL_BASED_OUTPATIENT_CLINIC_OR_DEPARTMENT_OTHER)
Admission: EM | Admit: 2017-07-09 | Discharge: 2017-07-09 | Disposition: A | Discharge status: Home / Self Care | Payer: BLUE CROSS/BLUE SHIELD | Attending: Emergency Medicine | Admitting: Emergency Medicine

## 2017-07-09 DIAGNOSIS — R109 Unspecified abdominal pain: Secondary | ICD-10-CM

## 2017-07-09 DIAGNOSIS — Z79899 Other long term (current) drug therapy: Secondary | ICD-10-CM | POA: Insufficient documentation

## 2017-07-09 DIAGNOSIS — N200 Calculus of kidney: Secondary | ICD-10-CM

## 2017-07-09 DIAGNOSIS — Z87891 Personal history of nicotine dependence: Secondary | ICD-10-CM | POA: Insufficient documentation

## 2017-07-09 DIAGNOSIS — Z791 Long term (current) use of non-steroidal anti-inflammatories (NSAID): Secondary | ICD-10-CM | POA: Insufficient documentation

## 2017-07-09 DIAGNOSIS — E876 Hypokalemia: Secondary | ICD-10-CM | POA: Insufficient documentation

## 2017-07-09 DIAGNOSIS — M545 Low back pain: Secondary | ICD-10-CM | POA: Diagnosis not present

## 2017-07-09 DIAGNOSIS — R1031 Right lower quadrant pain: Secondary | ICD-10-CM | POA: Diagnosis present

## 2017-07-09 LAB — CBC
HCT: 40.3 % (ref 36.0–46.0)
Hemoglobin: 13.5 g/dL (ref 12.0–15.0)
MCH: 27.7 pg (ref 26.0–34.0)
MCHC: 33.5 g/dL (ref 30.0–36.0)
MCV: 82.6 fL (ref 78.0–100.0)
Platelets: 228 10*3/uL (ref 150–400)
RBC: 4.88 MIL/uL (ref 3.87–5.11)
RDW: 14.9 % (ref 11.5–15.5)
WBC: 8.1 10*3/uL (ref 4.0–10.5)

## 2017-07-09 LAB — URINALYSIS, ROUTINE W REFLEX MICROSCOPIC
BILIRUBIN URINE: NEGATIVE
GLUCOSE, UA: NEGATIVE mg/dL
Hgb urine dipstick: NEGATIVE
Ketones, ur: NEGATIVE mg/dL
Leukocytes, UA: NEGATIVE
NITRITE: NEGATIVE
PH: 6 (ref 5.0–8.0)
Protein, ur: NEGATIVE mg/dL

## 2017-07-09 LAB — COMPREHENSIVE METABOLIC PANEL
ALBUMIN: 3.8 g/dL (ref 3.5–5.0)
ALK PHOS: 33 U/L — AB (ref 38–126)
ALT: 44 U/L (ref 14–54)
ANION GAP: 7 (ref 5–15)
AST: 32 U/L (ref 15–41)
BILIRUBIN TOTAL: 0.3 mg/dL (ref 0.3–1.2)
BUN: 15 mg/dL (ref 6–20)
CALCIUM: 9.2 mg/dL (ref 8.9–10.3)
CO2: 22 mmol/L (ref 22–32)
Chloride: 109 mmol/L (ref 101–111)
Creatinine, Ser: 0.82 mg/dL (ref 0.44–1.00)
GFR calc Af Amer: >60 mL/min (ref >60)
GFR calc non Af Amer: >60 mL/min (ref >60)
Glucose, Bld: 98 mg/dL (ref 65–99)
Potassium: 3.3 mmol/L — ABNORMAL LOW (ref 3.5–5.1)
Sodium: 138 mmol/L (ref 135–145)
TOTAL PROTEIN: 6.8 g/dL (ref 6.5–8.1)

## 2017-07-09 LAB — LIPASE, BLOOD: LIPASE: 61 U/L — AB (ref 11–51)

## 2017-07-09 MED ORDER — ONDANSETRON HCL 4 MG/2ML IJ SOLN
4.0000 mg | Freq: Once | INTRAMUSCULAR | Status: AC
  Administered 2017-07-09: 4 mg via INTRAVENOUS
  Filled 2017-07-09: qty 2

## 2017-07-09 MED ORDER — PROMETHAZINE HCL 25 MG/ML IJ SOLN
12.5000 mg | Freq: Once | INTRAMUSCULAR | Status: AC
  Administered 2017-07-09: 12.5 mg via INTRAVENOUS
  Filled 2017-07-09: qty 1

## 2017-07-09 MED ORDER — POTASSIUM CHLORIDE CRYS ER 20 MEQ PO TBCR
40.0000 meq | EXTENDED_RELEASE_TABLET | Freq: Once | ORAL | Status: AC
  Administered 2017-07-09: 40 meq via ORAL
  Filled 2017-07-09: qty 2

## 2017-07-09 MED ORDER — HYDROMORPHONE HCL 1 MG/ML IJ SOLN
0.5000 mg | Freq: Once | INTRAMUSCULAR | Status: AC
  Administered 2017-07-09: 0.5 mg via INTRAVENOUS
  Filled 2017-07-09: qty 1

## 2017-07-09 NOTE — ED Notes (Signed)
ED Provider at bedside. 

## 2017-07-09 NOTE — Discharge Instructions (Signed)
It was our pleasure to provide your ER care today - we hope that you feel better.  Your ct scan was read as showing no acute process - incidental note was made of a small, non-obstructing left sided kidney stone.  From your lab tests, your potassium level is mildly low (3.3) - eat plenty of fruits and vegetables, and follow up with primary care doctor.  Take motrin or aleve as need for pain.  Follow up with primary care doctor in the next few days.  Return to ER if worse, new symptoms, high fevers, persistent vomiting, new symptoms, or other concern.   You were given pain medication in the ER  - no driving for the next 6 hours.

## 2017-07-09 NOTE — ED Triage Notes (Signed)
R flank pain intermittently over the past few weeks. Worsening since yesterday with vomiting. Denies urinary symptoms.

## 2017-07-09 NOTE — ED Notes (Addendum)
Pt asking for pain and nausea meds. MD made aware that pt also wishes to have phenergan

## 2017-07-09 NOTE — ED Provider Notes (Signed)
[facility] Provider Note   CSN: [id number] Arrival date & time: 07/09/17  1640     History   Chief Complaint Chief Complaint  Patient presents with  . Flank Pain    HPI Kristin Jarvis is a 36 y.o. female.  Patient c/o right flank pain the past couple weeks. Pain constant, dull, moderate, non radiating. Nausea. No vomiting. No diarrhea. Denies strain or injury to area. No fever or chills. No change in appetite. Denies cough or uri c/o. No chest pain. No radicular/leg pain. No numbness/weakness. No dysuria or hematuria.    The history is provided by the patient.    Past Medical History:  Diagnosis Date  . Renal stone     Patient Active Problem List   Diagnosis Date Noted  . DYSPNEA 08/26/2008  . CHEST PAIN-UNSPECIFIED 08/26/2008  . HEADACHE, CHRONIC, HX OF 08/26/2008    Past Surgical History:  Procedure Laterality Date  . ABDOMINAL HYSTERECTOMY    . INCONTINENCE SURGERY    . SHOULDER SURGERY    . TONSILLECTOMY      OB History    No data available       Home Medications    Prior to Admission medications   Medication Sig Start Date End Date Taking? Authorizing Provider  ciprofloxacin (CIPRO) 500 MG tablet Take 1 tablet (500 mg total) by mouth 2 (two) times daily. 04/10/16   Renne Crigler, PA-C  HYDROcodone-acetaminophen (NORCO/VICODIN) 5-325 MG tablet Take 1-2 tablets by mouth every 6 (six) hours as needed. 04/09/16   Santiago Glad, PA-C  ibuprofen (ADVIL,MOTRIN) 800 MG tablet Take 1 tablet (800 mg total) by mouth 3 (three) times daily. 07/19/15   Rancour, Jeannett Senior, MD  ondansetron (ZOFRAN) 4 MG tablet Take 1 tablet (4 mg total) by mouth every 6 (six) hours. 04/09/16   Santiago Glad, PA-C    Family History No family history on file.  Social History Social History  Substance Use Topics  . Smoking status: Former Games developer  . Smokeless tobacco: Never Used  . Alcohol use Yes     Allergies   Sulfonamide derivatives   Review of  Systems Review of Systems  Constitutional: Negative for chills and fever.  HENT: Negative for sore throat.   Eyes: Negative for redness.  Respiratory: Negative for shortness of breath.   Cardiovascular: Negative for chest pain.  Gastrointestinal: Negative for diarrhea and vomiting.  Genitourinary: Positive for flank pain. Negative for dysuria, hematuria, vaginal bleeding and vaginal discharge.  Musculoskeletal: Positive for back pain.  Skin: Negative for rash.  Neurological: Negative for headaches.  Hematological: Does not bruise/bleed easily.  Psychiatric/Behavioral: Negative for confusion.     Physical Exam Updated Vital Signs BP 110/74 (BP Location: Right Arm)   Pulse 78   Temp 98.8 F (37.1 C) (Oral)   Resp 18   Ht 1.829 m (6')   Wt 91.6 kg (202 lb)   SpO2 100%   BMI 27.40 kg/m   Physical Exam  Constitutional: She appears well-developed and well-nourished. No distress.  HENT:  Head: Atraumatic.  Eyes: Conjunctivae are normal. No scleral icterus.  Neck: Neck supple. No tracheal deviation present.  Cardiovascular: Normal rate, regular rhythm, normal heart sounds and intact distal pulses.  Exam reveals no gallop and no friction rub.   No murmur heard. Pulmonary/Chest: Effort normal and breath sounds normal. No respiratory distress.  Abdominal: Soft. Normal appearance and bowel sounds are normal. She exhibits no distension and no mass. There is no tenderness. There is no rebound  and no guarding. No hernia.  Genitourinary:  Genitourinary Comments: No cva tenderness  Musculoskeletal: She exhibits no edema.  Neurological: She is alert.  Skin: Skin is warm and dry. No rash noted. She is not diaphoretic.  Psychiatric: She has a normal mood and affect.  Nursing note and vitals reviewed.    ED Treatments / Results  Labs (all labs ordered are listed, but only abnormal results are displayed) Results for orders placed or performed during the hospital encounter of 07/09/17   Urinalysis, Routine w reflex microscopic- may I&O cath if menses  Result Value Ref Range   Color, Urine YELLOW YELLOW   APPearance CLEAR CLEAR   Specific Gravity, Urine <1.005 (L) 1.005 - 1.030   pH 6.0 5.0 - 8.0   Glucose, UA NEGATIVE NEGATIVE mg/dL   Hgb urine dipstick NEGATIVE NEGATIVE   Bilirubin Urine NEGATIVE NEGATIVE   Ketones, ur NEGATIVE NEGATIVE mg/dL   Protein, ur NEGATIVE NEGATIVE mg/dL   Nitrite NEGATIVE NEGATIVE   Leukocytes, UA NEGATIVE NEGATIVE  Comprehensive metabolic panel  Result Value Ref Range   Sodium 138 135 - 145 mmol/L   Potassium 3.3 (L) 3.5 - 5.1 mmol/L   Chloride 109 101 - 111 mmol/L   CO2 22 22 - 32 mmol/L   Glucose, Bld 98 65 - 99 mg/dL   BUN 15 6 - 20 mg/dL   Creatinine, Ser 1.61 0.44 - 1.00 mg/dL   Calcium 9.2 8.9 - 09.6 mg/dL   Total Protein 6.8 6.5 - 8.1 g/dL   Albumin 3.8 3.5 - 5.0 g/dL   AST 32 15 - 41 U/L   ALT 44 14 - 54 U/L   Alkaline Phosphatase 33 (L) 38 - 126 U/L   Total Bilirubin 0.3 0.3 - 1.2 mg/dL   GFR calc non Af Amer >60 >60 mL/min   GFR calc Af Amer >60 >60 mL/min   Anion gap 7 5 - 15  CBC  Result Value Ref Range   WBC 8.1 4.0 - 10.5 K/uL   RBC 4.88 3.87 - 5.11 MIL/uL   Hemoglobin 13.5 12.0 - 15.0 g/dL   HCT 04.5 40.9 - 81.1 %   MCV 82.6 78.0 - 100.0 fL   MCH 27.7 26.0 - 34.0 pg   MCHC 33.5 30.0 - 36.0 g/dL   RDW 91.4 78.2 - 95.6 %   Platelets 228 150 - 400 K/uL  Lipase, blood  Result Value Ref Range   Lipase 61 (H) 11 - 51 U/L    EKG  EKG Interpretation None       Radiology Ct Renal Stone Study  Result Date: 07/09/2017 CLINICAL DATA:  Acute onset of right flank pain, extending down the right leg. Dysuria. Nausea. Initial encounter. EXAM: CT ABDOMEN AND PELVIS WITHOUT CONTRAST TECHNIQUE: Multidetector CT imaging of the abdomen and pelvis was performed following the standard protocol without IV contrast. COMPARISON:  CT of the abdomen and pelvis performed 06/14/2016 FINDINGS: Lower chest: The visualized lung  bases are grossly clear. The visualized portions of the mediastinum are unremarkable. Hepatobiliary: The liver is unremarkable in appearance. The gallbladder is unremarkable in appearance. The common bile duct remains normal in caliber. Pancreas: The pancreas is within normal limits. Spleen: The spleen is unremarkable in appearance. Adrenals/Urinary Tract: The adrenal glands are unremarkable in appearance. Small nonobstructing stones are noted at the lower pole of the left kidney, measuring up to 3 mm in size. There is no evidence of hydronephrosis. No obstructing ureteral stones are identified. No perinephric stranding  is seen. Stomach/Bowel: The stomach is unremarkable in appearance. The small bowel is within normal limits. The appendix is normal in caliber, without evidence of appendicitis. The colon is unremarkable in appearance. Scattered pills are noted within small and large bowel loops. Vascular/Lymphatic: The abdominal aorta is unremarkable in appearance. The inferior vena cava is grossly unremarkable. No retroperitoneal lymphadenopathy is seen. No pelvic sidewall lymphadenopathy is identified. Reproductive: The bladder is mildly distended and grossly unremarkable. The patient is status post hysterectomy. No suspicious adnexal masses are seen. A clip is noted at the pelvic cul-de-sac. Other: No additional soft tissue abnormalities are seen. Musculoskeletal: No acute osseous abnormalities are identified. The visualized musculature is unremarkable in appearance. IMPRESSION: 1. No acute abnormality seen to explain the patient's symptoms. No evidence of hydronephrosis. 2. Small nonobstructing stones at the lower pole of the left kidney, measuring up to 3 mm in size. Electronically Signed   By: Roanna Raider M.D.   On: 07/09/2017 21:24    Procedures Procedures (including critical care time)  Medications Ordered in ED Medications  HYDROmorphone (DILAUDID) injection 0.5 mg (not administered)    promethazine (PHENERGAN) injection 12.5 mg (not administered)  ondansetron (ZOFRAN) injection 4 mg (4 mg Intravenous Given 07/09/17 1839)     Initial Impression / Assessment and Plan / ED Course  I have reviewed the triage vital signs and the nursing notes.  Pertinent labs & imaging results that were available during my care of the patient were reviewed by me and considered in my medical decision making (see chart for details).  Iv ns.    Pt indicates zofran doesn't work for her, requests phenergan 12.5 mg iv. Dilaudid .5 mg iv. Labs.  Reviewed nursing notes and prior charts for additional history.  Pt c/o recurrent pain. Requests imaging.   Ct completed - neg for acute process - discussed incidental note left non-obstructing stone.    On recheck pt comfortable and appears stable for d/c.       Final Clinical Impressions(s) / ED Diagnoses   Final diagnoses:  None    New Prescriptions New Prescriptions   No medications on file     Cathren Laine, MD 07/09/17 2151

## 2017-07-09 NOTE — ED Notes (Signed)
Patient transported to CT 

## 2018-10-28 ENCOUNTER — Encounter (HOSPITAL_BASED_OUTPATIENT_CLINIC_OR_DEPARTMENT_OTHER): Payer: Self-pay | Admitting: Emergency Medicine

## 2018-10-28 ENCOUNTER — Other Ambulatory Visit: Payer: Self-pay

## 2018-10-28 ENCOUNTER — Emergency Department (HOSPITAL_BASED_OUTPATIENT_CLINIC_OR_DEPARTMENT_OTHER)
Admission: EM | Admit: 2018-10-28 | Discharge: 2018-10-28 | Disposition: A | Discharge status: Home / Self Care | Payer: BLUE CROSS/BLUE SHIELD | Attending: Emergency Medicine | Admitting: Emergency Medicine

## 2018-10-28 ENCOUNTER — Emergency Department (HOSPITAL_BASED_OUTPATIENT_CLINIC_OR_DEPARTMENT_OTHER): Payer: BLUE CROSS/BLUE SHIELD

## 2018-10-28 DIAGNOSIS — Z79899 Other long term (current) drug therapy: Secondary | ICD-10-CM | POA: Insufficient documentation

## 2018-10-28 DIAGNOSIS — K529 Noninfective gastroenteritis and colitis, unspecified: Secondary | ICD-10-CM | POA: Diagnosis not present

## 2018-10-28 DIAGNOSIS — R110 Nausea: Secondary | ICD-10-CM | POA: Diagnosis not present

## 2018-10-28 DIAGNOSIS — Z87891 Personal history of nicotine dependence: Secondary | ICD-10-CM | POA: Diagnosis not present

## 2018-10-28 DIAGNOSIS — R109 Unspecified abdominal pain: Secondary | ICD-10-CM | POA: Diagnosis present

## 2018-10-28 HISTORY — DX: Irritable bowel syndrome, unspecified: K58.9

## 2018-10-28 LAB — COMPREHENSIVE METABOLIC PANEL
ALT: 23 U/L (ref 0–44)
ANION GAP: 6 (ref 5–15)
AST: 18 U/L (ref 15–41)
Albumin: 4.2 g/dL (ref 3.5–5.0)
Alkaline Phosphatase: 37 U/L — ABNORMAL LOW (ref 38–126)
BUN: 11 mg/dL (ref 6–20)
CO2: 19 mmol/L — ABNORMAL LOW (ref 22–32)
Calcium: 8.9 mg/dL (ref 8.9–10.3)
Chloride: 112 mmol/L — ABNORMAL HIGH (ref 98–111)
Creatinine, Ser: 0.95 mg/dL (ref 0.44–1.00)
GFR calc Af Amer: >60 mL/min (ref >60)
GFR calc non Af Amer: >60 mL/min (ref >60)
Glucose, Bld: 90 mg/dL (ref 70–99)
Potassium: 3.5 mmol/L (ref 3.5–5.1)
Sodium: 137 mmol/L (ref 135–145)
Total Bilirubin: 0.4 mg/dL (ref 0.3–1.2)
Total Protein: 7 g/dL (ref 6.5–8.1)

## 2018-10-28 LAB — CBC
HCT: 43.1 % (ref 36.0–46.0)
Hemoglobin: 14 g/dL (ref 12.0–15.0)
MCH: 27.9 pg (ref 26.0–34.0)
MCHC: 32.5 g/dL (ref 30.0–36.0)
MCV: 86 fL (ref 80.0–100.0)
NRBC: 0 % (ref 0.0–0.2)
Platelets: 208 10*3/uL (ref 150–400)
RBC: 5.01 MIL/uL (ref 3.87–5.11)
RDW: 13.3 % (ref 11.5–15.5)
WBC: 7.6 10*3/uL (ref 4.0–10.5)

## 2018-10-28 LAB — PREGNANCY, URINE: Preg Test, Ur: NEGATIVE

## 2018-10-28 LAB — URINALYSIS, ROUTINE W REFLEX MICROSCOPIC
Bilirubin Urine: NEGATIVE
GLUCOSE, UA: NEGATIVE mg/dL
Ketones, ur: 15 mg/dL — AB
Leukocytes, UA: NEGATIVE
Nitrite: NEGATIVE
Protein, ur: NEGATIVE mg/dL
Specific Gravity, Urine: <1.005 — ABNORMAL LOW (ref 1.005–1.030)
pH: 7 (ref 5.0–8.0)

## 2018-10-28 LAB — URINALYSIS, MICROSCOPIC (REFLEX)

## 2018-10-28 LAB — LIPASE, BLOOD: Lipase: 42 U/L (ref 11–51)

## 2018-10-28 MED ORDER — FENTANYL CITRATE (PF) 100 MCG/2ML IJ SOLN
50.0000 ug | Freq: Once | INTRAMUSCULAR | Status: AC
  Administered 2018-10-28: 50 ug via INTRAVENOUS
  Filled 2018-10-28: qty 2

## 2018-10-28 MED ORDER — SODIUM CHLORIDE 0.9% FLUSH
3.0000 mL | Freq: Once | INTRAVENOUS | Status: DC
  Filled 2018-10-28: qty 3

## 2018-10-28 MED ORDER — ALUM & MAG HYDROXIDE-SIMETH 200-200-20 MG/5ML PO SUSP
30.0000 mL | Freq: Once | ORAL | Status: AC
  Administered 2018-10-28: 30 mL via ORAL
  Filled 2018-10-28: qty 30

## 2018-10-28 MED ORDER — GI COCKTAIL ~~LOC~~: 30.0000 mL | Freq: Two times a day (BID) | ORAL | 0 refills | Status: DC | PRN

## 2018-10-28 MED ORDER — SODIUM CHLORIDE 0.9 % IV BOLUS
1000.0000 mL | Freq: Once | INTRAVENOUS | Status: AC
  Administered 2018-10-28: 1000 mL via INTRAVENOUS

## 2018-10-28 MED ORDER — IOPAMIDOL (ISOVUE-300) INJECTION 61%
100.0000 mL | Freq: Once | INTRAVENOUS | Status: AC | PRN
  Administered 2018-10-28: 100 mL via INTRAVENOUS

## 2018-10-28 MED ORDER — DICYCLOMINE HCL 10 MG PO CAPS
20.0000 mg | ORAL_CAPSULE | Freq: Once | ORAL | Status: AC
  Administered 2018-10-28: 20 mg via ORAL
  Filled 2018-10-28: qty 2

## 2018-10-28 MED ORDER — OXYCODONE HCL 5 MG PO TABS
5.0000 mg | ORAL_TABLET | Freq: Once | ORAL | Status: AC
  Administered 2018-10-28: 5 mg via ORAL
  Filled 2018-10-28: qty 1

## 2018-10-28 MED ORDER — LIDOCAINE VISCOUS HCL 2 % MT SOLN
15.0000 mL | Freq: Once | OROMUCOSAL | Status: AC
  Administered 2018-10-28: 15 mL via ORAL
  Filled 2018-10-28: qty 15

## 2018-10-28 MED ORDER — OXYCODONE HCL 5 MG PO TABS: 5.0000 mg | ORAL_TABLET | ORAL | 0 refills | Status: DC | PRN

## 2018-10-28 MED ORDER — ONDANSETRON HCL 4 MG/2ML IJ SOLN
4.0000 mg | Freq: Once | INTRAMUSCULAR | Status: AC
  Administered 2018-10-28: 4 mg via INTRAVENOUS
  Filled 2018-10-28: qty 2

## 2018-10-28 MED ORDER — DICYCLOMINE HCL 20 MG PO TABS: 20.0000 mg | ORAL_TABLET | Freq: Two times a day (BID) | ORAL | 0 refills | Status: DC

## 2018-10-28 NOTE — ED Provider Notes (Addendum)
[facility] EMERGENCY DEPARTMENT Provider Note   CSN: [id number] Arrival date & time: 10/28/18  1443     History   Chief Complaint Chief Complaint  Patient presents with  . Abdominal Pain    HPI Kristin Jarvis is a 38 y.o. female.  The history is provided by the patient and medical records.  Abdominal Pain  Associated symptoms: diarrhea and nausea      38 year old female with history of IBS, kidney stones, presenting to the ED with abdominal pain.  Reports her daughter was sick last week with gastroenteritis, thought initially she had picked that up from her.  She reports since then she has had worsening lower abdominal pain, more so on her right side.  States she has had numerous episodes of loose, watery diarrhea which is somewhat odd for her as she has issues with constipation and usually has to take MiraLAX.  She did take it earlier in the week but is continued to have loose stools.  She denies any vomiting but has had some nausea.  She has not really been able to eat or drink much today due to nausea.  She does not report any fevers.  She is not recently been on antibiotics or had travel outside the country.  Has had prior hysterectomy with bladder sling.  Patient does report hospitalization twice last year due to "infections" in her colon.  Past Medical History:  Diagnosis Date  . IBS (irritable bowel syndrome)   . Renal stone     Patient Active Problem List   Diagnosis Date Noted  . DYSPNEA 08/26/2008  . CHEST PAIN-UNSPECIFIED 08/26/2008  . HEADACHE, CHRONIC, HX OF 08/26/2008    Past Surgical History:  Procedure Laterality Date  . ABDOMINAL HYSTERECTOMY    . INCONTINENCE SURGERY    . INCONTINENCE SURGERY    . SHOULDER SURGERY    . TONSILLECTOMY       OB History   No obstetric history on file.      Home Medications    Prior to Admission medications   Medication Sig Start Date End Date Taking? Authorizing Provider  baclofen (LIORESAL) 10 MG  tablet Take by mouth. 05/15/15  Yes [provider]  dicyclomine (BENTYL) 20 MG tablet Take one tablet every 6 hours as needed for pain 08/23/18  Yes [provider]  eletriptan (RELPAX) 40 MG tablet Take by mouth. 07/06/17  Yes [provider]  famotidine (PEPCID) 20 MG tablet Take by mouth. 07/01/16  Yes [provider]  Galcanezumab-gnlm 120 MG/ML SOAJ Inject into the skin. 04/27/18  Yes [provider]  norethindrone-ethinyl estradiol (MICROGESTIN,JUNEL,LOESTRIN) 1-20 MG-MCG tablet TAKE 1 TABLET BY MOUTH DAILY. PT TAKES THIS IN A CONTINOUS FASHION SKIPPING SUGAR PILLS. 05/29/18  Yes [provider]  omeprazole (PRILOSEC) 40 MG capsule Take by mouth. 05/09/14  Yes [provider]  polyethylene glycol (MIRALAX / GLYCOLAX) packet Take by mouth. 12/14/17  Yes [provider]  promethazine (PHENERGAN) 12.5 MG tablet Take 1 or 2 tabs as needed for nausea up to three times a day 01/31/18  Yes [provider]  Topiramate ER (TROKENDI XR) 200 MG CP24 TAKE ONE CAPSULE (200 MG DOSE) BY MOUTH DAILY. 10/04/17  Yes [provider]  verapamil (VERELAN) 100 MG 24 hr capsule Take by mouth. 12/22/14 09/18/19 Yes [provider]  Ascorbic Acid (VITAMIN C) 100 MG tablet Take by mouth.    [provider]  Ascorbic Acid (VITAMIN C) 500 MG CHEW Chew by  mouth.    [provider]  b complex vitamins tablet Take by mouth.    [provider]  ciprofloxacin (CIPRO) 500 MG tablet Take 1 tablet (500 mg total) by mouth 2 (two) times daily. 04/10/16   Renne CriglerGeiple, Joshua, PA-C  HYDROcodone-acetaminophen (NORCO/VICODIN) 5-325 MG tablet Take 1-2 tablets by mouth every 6 (six) hours as needed. 04/09/16   Santiago GladLaisure, Heather, PA-C  ibuprofen (ADVIL,MOTRIN) 800 MG tablet Take 1 tablet (800 mg total) by mouth 3 (three) times daily. 07/19/15   Rancour, Jeannett SeniorStephen, MD  Magnesium 250 MG TABS Take by mouth.    [provider]    Multiple Vitamins-Minerals (MULTIVITAMIN ADULTS) TABS Take by mouth.    [provider]  ondansetron (ZOFRAN) 4 MG tablet Take 1 tablet (4 mg total) by mouth every 6 (six) hours. 04/09/16   Santiago GladLaisure, Heather, PA-C    Family History History reviewed. No pertinent family history.  Social History Social History   Tobacco Use  . Smoking status: Former Games developermoker  . Smokeless tobacco: Never Used  Substance Use Topics  . Alcohol use: Yes  . Drug use: Not on file     Allergies   Acetaminophen; Pyridium [phenazopyridine hcl]; and Sulfonamide derivatives   Review of Systems Review of Systems  Gastrointestinal: Positive for abdominal pain, diarrhea and nausea.  All other systems reviewed and are negative.    Physical Exam Updated Vital Signs BP 134/81 (BP Location: Left Arm)   Pulse 89   Temp 97.8 F (36.6 C) (Oral)   Resp 20   Ht 6' (1.829 m)   Wt 85.3 kg   SpO2 100%   BMI 25.50 kg/m   Physical Exam Vitals signs and nursing note reviewed.  Constitutional:      Appearance: She is well-developed.  HENT:     Head: Normocephalic and atraumatic.  Eyes:     Conjunctiva/sclera: Conjunctivae normal.     Pupils: Pupils are equal, round, and reactive to light.  Neck:     Musculoskeletal: Normal range of motion.  Cardiovascular:     Rate and Rhythm: Normal rate and regular rhythm.     Heart sounds: Normal heart sounds.  Pulmonary:     Effort: Pulmonary effort is normal.     Breath sounds: Normal breath sounds.  Abdominal:     General: Bowel sounds are normal.     Palpations: Abdomen is soft.     Tenderness: There is abdominal tenderness in the right lower quadrant, suprapubic area and left lower quadrant.  Musculoskeletal: Normal range of motion.  Skin:    General: Skin is warm and dry.  Neurological:     Mental Status: She is alert and oriented to person, place, and time.      ED Treatments / Results  Labs (all labs ordered are listed, but only abnormal  results are displayed) Labs Reviewed  COMPREHENSIVE METABOLIC PANEL - Abnormal; Notable for the following components:      Result Value   Chloride 112 (*)    CO2 19 (*)    Alkaline Phosphatase 37 (*)    All other components within normal limits  URINALYSIS, ROUTINE W REFLEX MICROSCOPIC - Abnormal; Notable for the following components:   Color, Urine STRAW (*)    APPearance HAZY (*)    Specific Gravity, Urine <1.005 (*)    Hgb urine dipstick TRACE (*)    Ketones, ur 15 (*)    All other components within normal limits  URINALYSIS, MICROSCOPIC (REFLEX) - Abnormal; Notable for  the following components:   Bacteria, UA FEW (*)    All other components within normal limits  LIPASE, BLOOD  CBC  PREGNANCY, URINE    EKG None  Radiology Ct Abdomen Pelvis W Contrast  Result Date: 10/28/2018 CLINICAL DATA:  Abdominal pain for the past 3 days with diarrhea and nausea. Normal lipase. EXAM: CT ABDOMEN AND PELVIS WITH CONTRAST TECHNIQUE: Multidetector CT imaging of the abdomen and pelvis was performed using the standard protocol following bolus administration of intravenous contrast. CONTRAST:  100mL ISOVUE-300 IOPAMIDOL (ISOVUE-300) INJECTION 61% COMPARISON:  11/03/2017. FINDINGS: Lower chest: Unremarkable. Hepatobiliary: No focal liver abnormality is seen. No gallstones, gallbladder wall thickening, or biliary dilatation. Pancreas: Interval slight ill definition of the margins of the body of the pancreas. Otherwise, normal. Spleen: Normal in size without focal abnormality. Adrenals/Urinary Tract: Normal appearing adrenal glands. Small upper pole right renal cyst. Three small lower pole left renal calculi measuring up to 3 mm in maximum diameter each. Normal appearing urinary bladder and ureters. No hydronephrosis. Stomach/Bowel: Fluid throughout the majority of the colon. Normal appearing appendix, small bowel and stomach. No bowel wall thickening or adjacent inflammatory changes. Vascular/Lymphatic: No  significant vascular findings are present. No enlarged abdominal or pelvic lymph nodes. Reproductive: Status post hysterectomy. No adnexal masses. Other: No abdominal wall hernia or abnormality. No abdominopelvic ascites. Musculoskeletal: Unremarkable bones. IMPRESSION: 1. Fluid throughout the majority of the colon. This can be seen with gastroenteritis. 2. Interval slight ill definition of the margins of the body of the pancreas. This could represent early, minimal changes of acute pancreatitis. This could also be artifactual due to volume averaging and slight motion. 3. Three small, nonobstructing lower pole left renal calculi. Electronically Signed   By: Beckie SaltsSteven  Reid M.D.   On: 10/28/2018 17:44    Procedures Procedures (including critical care time)  Medications Ordered in ED Medications  sodium chloride flush (NS) 0.9 % injection 3 mL (3 mLs Intravenous Not Given 10/28/18 1605)  ondansetron (ZOFRAN) injection 4 mg (4 mg Intravenous Given 10/28/18 1635)  fentaNYL (SUBLIMAZE) injection 50 mcg (50 mcg Intravenous Given 10/28/18 1635)  sodium chloride 0.9 % bolus 1,000 mL (0 mLs Intravenous Stopped 10/28/18 1739)  iopamidol (ISOVUE-300) 61 % injection 100 mL (100 mLs Intravenous Contrast Given 10/28/18 1704)  oxyCODONE (Oxy IR/ROXICODONE) immediate release tablet 5 mg (5 mg Oral Given 10/28/18 1806)  dicyclomine (BENTYL) capsule 20 mg (20 mg Oral Given 10/28/18 1805)     Initial Impression / Assessment and Plan / ED Course  I have reviewed the triage vital signs and the nursing notes.  Pertinent labs & imaging results that were available during my care of the patient were reviewed by me and considered in my medical decision making (see chart for details).  38 year old female here with lower abdominal pain.  Daughter was sick with stomach flu last week in the same she came down with same but has had some worsening pain.  She is afebrile and nontoxic in appearance here.  Does have tenderness along the  lower abdomen but no peritoneal signs.  Does report history of IBS and hospitalization twice last year due to "intestinal infections".  Plan for screening labs, IV fluids, symptomatic control.  We will also obtain CT scan.  Labs are overall reassuring.  UA without any signs of infection.  CT scan with findings of likely gastroenteritis.  Patient appears improved after IV fluids.  Results discussed with patient and her husband at the bedside, they acknowledged understanding.  In  light of her underlying IBS, have recommended that she follow-up closely with her gastroenterologist, will continue symptomatic care in the interim.  Gentle diet for now, lots of oral fluids.   She can return here for any new/acute changes.    6:34 PM At time of discharge, patient upset that she is not 100% better.  We have explained to her that nature of this illness will be progressive improvement.  Her labs are reassuring, vitals stable, no vomiting or diarrhea while here in the ED.  She has tolerated oral fluids and medications here as well.  At this point, do not feel she needs further intervention here.  She did begin complaining of some acid reflux symptoms, given GI cocktail which helped.  Plan to discharge home with plan as above.  She will follow-up with her GI doctor tomorrow.  Final Clinical Impressions(s) / ED Diagnoses   Final diagnoses:  Gastroenteritis    ED Discharge Orders         Ordered    dicyclomine (BENTYL) 20 MG tablet  2 times daily     10/28/18 1809    oxyCODONE (OXY IR/ROXICODONE) 5 MG immediate release tablet  Every 4 hours PRN     10/28/18 1809           Garlon Hatchet, PA-C 10/28/18 1820    Garlon Hatchet, PA-C 10/28/18 1923    Tegeler, Canary Brim, MD 10/29/18 [phone]

## 2018-10-28 NOTE — ED Notes (Signed)
Patient transported to CT 

## 2018-10-28 NOTE — Discharge Instructions (Signed)
Take the prescribed medication as directed.  Lots of fluids for now, gentle diet and progress back to normal as tolerated. Follow-up with your primary care doctor.  May also be worthwhile to follow-up with your GI doctor. Return to the ED for new or worsening symptoms.

## 2018-10-28 NOTE — ED Notes (Signed)
Refused to be D/C'ed, due to burning in her throat after taking last does of medicine. PA informed

## 2018-10-28 NOTE — ED Notes (Signed)
Pt reports improvement after GI cocktail; she was able to tolerate liquids

## 2018-10-28 NOTE — ED Triage Notes (Signed)
Patient reports that she is having lower pelvic / abdominal pain - the patient reports that she is having frequent loose stools after taking Miralax and Nausea - patient also reports that she has passed out 2 times

## 2018-10-28 NOTE — ED Notes (Signed)
Pt not in room at this time

## 2019-08-25 ENCOUNTER — Other Ambulatory Visit: Payer: Self-pay | Admitting: Cardiology

## 2019-08-25 DIAGNOSIS — Z20822 Contact with and (suspected) exposure to covid-19: Secondary | ICD-10-CM

## 2019-08-26 LAB — NOVEL CORONAVIRUS, NAA: SARS-CoV-2, NAA: NOT DETECTED

## 2019-12-22 ENCOUNTER — Other Ambulatory Visit: Payer: Self-pay

## 2019-12-22 ENCOUNTER — Emergency Department (HOSPITAL_BASED_OUTPATIENT_CLINIC_OR_DEPARTMENT_OTHER)
Admission: EM | Admit: 2019-12-22 | Discharge: 2019-12-22 | Disposition: A | Discharge status: Home / Self Care | Payer: No Typology Code available for payment source | Attending: Emergency Medicine | Admitting: Emergency Medicine

## 2019-12-22 ENCOUNTER — Emergency Department (HOSPITAL_BASED_OUTPATIENT_CLINIC_OR_DEPARTMENT_OTHER): Payer: No Typology Code available for payment source

## 2019-12-22 ENCOUNTER — Encounter (HOSPITAL_BASED_OUTPATIENT_CLINIC_OR_DEPARTMENT_OTHER): Payer: Self-pay | Admitting: Emergency Medicine

## 2019-12-22 DIAGNOSIS — R0602 Shortness of breath: Secondary | ICD-10-CM | POA: Diagnosis not present

## 2019-12-22 DIAGNOSIS — R0789 Other chest pain: Secondary | ICD-10-CM | POA: Insufficient documentation

## 2019-12-22 DIAGNOSIS — R079 Chest pain, unspecified: Secondary | ICD-10-CM | POA: Diagnosis not present

## 2019-12-22 DIAGNOSIS — Z79899 Other long term (current) drug therapy: Secondary | ICD-10-CM | POA: Insufficient documentation

## 2019-12-22 DIAGNOSIS — Z8616 Personal history of COVID-19: Secondary | ICD-10-CM | POA: Diagnosis not present

## 2019-12-22 DIAGNOSIS — Z87891 Personal history of nicotine dependence: Secondary | ICD-10-CM | POA: Diagnosis not present

## 2019-12-22 HISTORY — DX: COVID-19: U07.1

## 2019-12-22 LAB — CBC WITH DIFFERENTIAL/PLATELET
Abs Immature Granulocytes: 0.01 10*3/uL (ref 0.00–0.07)
Basophils Absolute: 0 10*3/uL (ref 0.0–0.1)
Basophils Relative: 1 %
Eosinophils Absolute: 0.1 10*3/uL (ref 0.0–0.5)
Eosinophils Relative: 2 %
HCT: 42.4 % (ref 36.0–46.0)
Hemoglobin: 14 g/dL (ref 12.0–15.0)
Immature Granulocytes: 0 %
Lymphocytes Relative: 34 %
Lymphs Abs: 2.1 10*3/uL (ref 0.7–4.0)
MCH: 28.9 pg (ref 26.0–34.0)
MCHC: 33 g/dL (ref 30.0–36.0)
MCV: 87.6 fL (ref 80.0–100.0)
Monocytes Absolute: 0.4 10*3/uL (ref 0.1–1.0)
Monocytes Relative: 6 %
Neutro Abs: 3.6 10*3/uL (ref 1.7–7.7)
Neutrophils Relative %: 57 %
Platelets: 247 10*3/uL (ref 150–400)
RBC: 4.84 MIL/uL (ref 3.87–5.11)
RDW: 13.8 % (ref 11.5–15.5)
WBC: 6.3 10*3/uL (ref 4.0–10.5)
nRBC: 0 % (ref 0.0–0.2)

## 2019-12-22 LAB — BASIC METABOLIC PANEL
Anion gap: 9 (ref 5–15)
BUN: 13 mg/dL (ref 6–20)
CO2: 20 mmol/L — ABNORMAL LOW (ref 22–32)
Calcium: 9 mg/dL (ref 8.9–10.3)
Chloride: 108 mmol/L (ref 98–111)
Creatinine, Ser: 1.02 mg/dL — ABNORMAL HIGH (ref 0.44–1.00)
GFR calc Af Amer: >60 mL/min (ref >60)
GFR calc non Af Amer: >60 mL/min (ref >60)
Glucose, Bld: 113 mg/dL — ABNORMAL HIGH (ref 70–99)
Potassium: 3.8 mmol/L (ref 3.5–5.1)
Sodium: 137 mmol/L (ref 135–145)

## 2019-12-22 LAB — TROPONIN I (HIGH SENSITIVITY)
Troponin I (High Sensitivity): <2 ng/L (ref <18)
Troponin I (High Sensitivity): <2 ng/L (ref <18)

## 2019-12-22 LAB — D-DIMER, QUANTITATIVE: D-Dimer, Quant: 0.35 ug/mL-FEU (ref 0.00–0.50)

## 2019-12-22 MED ORDER — KETOROLAC TROMETHAMINE 15 MG/ML IJ SOLN
15.0000 mg | Freq: Once | INTRAMUSCULAR | Status: AC
  Administered 2019-12-22: 15 mg via INTRAVENOUS
  Filled 2019-12-22: qty 1

## 2019-12-22 MED ORDER — FENTANYL CITRATE (PF) 100 MCG/2ML IJ SOLN
50.0000 ug | Freq: Once | INTRAMUSCULAR | Status: AC
  Administered 2019-12-22: 50 ug via INTRAVENOUS
  Filled 2019-12-22: qty 2

## 2019-12-22 NOTE — ED Provider Notes (Signed)
[facility] DEPART[facility] Provider Note   CSN: [id number] Arrival date & time: 12/22/19  1131     History Chief Complaint  Patient presents with  . Chest Pain  . Shortness of Breath    Kristin Jarvis is a 39 y.o. female.  Presented to ER with chief complaint chest pain.  Patient states that ever since she had Covid in January, has had myriad symptoms, most concerning has been shortness of breath.  States that shortness of breath is relatively constant, worse with exertion.  Has not had any significant changes in his shortness of breath over the past few weeks.  New symptoms today is chest pain.  Middle to left side, sharp, stabbing pain, moderate to severe in severity.  Relatively constant, no alleviating factors, not associated with exertion.  Started while she was at church.  No new cough, no fever, no LOC, no nausea vomiting or abdominal pain.  Denies prior history of CAD, DVT/PE.  Non-smoker.  HPI     Past Medical History:  Diagnosis Date  . COVID-19   . IBS (irritable bowel syndrome)   . Renal stone     Patient Active Problem List   Diagnosis Date Noted  . DYSPNEA 08/26/2008  . CHEST PAIN-UNSPECIFIED 08/26/2008  . HEADACHE, CHRONIC, HX OF 08/26/2008    Past Surgical History:  Procedure Laterality Date  . ABDOMINAL HYSTERECTOMY    . COLONOSCOPY    . INCONTINENCE SURGERY    . INCONTINENCE SURGERY    . SHOULDER SURGERY    . TONSILLECTOMY       OB History   No obstetric history on file.     No family history on file.  Social History   Tobacco Use  . Smoking status: Former Research scientist (life sciences)  . Smokeless tobacco: Never Used  Substance Use Topics  . Alcohol use: Yes  . Drug use: Not Currently    Home Medications Prior to Admission medications   Medication Sig Start Date End Date Taking? Authorizing Provider  Alum & Mag Hydroxide-Simeth (GI COCKTAIL) SUSP suspension Take 30 mLs by mouth 2 (two) times daily as needed for indigestion. Shake well.  10/28/18   Larene Pickett, PA-C  Ascorbic Acid (VITAMIN C) 100 MG tablet Take by mouth.    [provider]  Ascorbic Acid (VITAMIN C) 500 MG CHEW Chew by mouth.    [provider]  b complex vitamins tablet Take by mouth.    [provider]  baclofen (LIORESAL) 10 MG tablet Take by mouth. 05/15/15   [provider]  ciprofloxacin (CIPRO) 500 MG tablet Take 1 tablet (500 mg total) by mouth 2 (two) times daily. 04/10/16   Carlisle Cater, PA-C  dicyclomine (BENTYL) 20 MG tablet Take 1 tablet (20 mg total) by mouth 2 (two) times daily. 10/28/18   Larene Pickett, PA-C  eletriptan (RELPAX) 40 MG tablet Take by mouth. 07/06/17   [provider]  famotidine (PEPCID) 20 MG tablet Take by mouth. 07/01/16   [provider]  Galcanezumab-gnlm 120 MG/ML SOAJ Inject into the skin. 04/27/18   [provider]  HYDROcodone-acetaminophen (NORCO/VICODIN) 5-325 MG tablet Take 1-2 tablets by mouth every 6 (six) hours as needed. 04/09/16   Hyman Bible, PA-C  ibuprofen (ADVIL,MOTRIN) 800 MG tablet Take 1 tablet (800 mg total) by mouth 3 (three) times daily. 07/19/15   Rancour, Annie Main, MD  Magnesium 250 MG TABS Take by mouth.    [provider]  Multiple Vitamins-Minerals (MULTIVITAMIN ADULTS) TABS  Take by mouth.    [provider]  norethindrone-ethinyl estradiol (MICROGESTIN,JUNEL,LOESTRIN) 1-20 MG-MCG tablet TAKE 1 TABLET BY MOUTH DAILY. PT TAKES THIS IN A CONTINOUS FASHION SKIPPING SUGAR PILLS. 05/29/18   [provider]  omeprazole (PRILOSEC) 40 MG capsule Take by mouth. 05/09/14   [provider]  ondansetron (ZOFRAN) 4 MG tablet Take 1 tablet (4 mg total) by mouth every 6 (six) hours. 04/09/16   Hyman Bible, PA-C  oxyCODONE (OXY IR/ROXICODONE) 5 MG immediate release tablet Take 1 tablet (5 mg total) by mouth every 4 (four) hours as needed for severe pain. 10/28/18   Larene Pickett, PA-C  polyethylene glycol [facility] /  Floria Raveling) packet Take by mouth. 12/14/17   [provider]  promethazine (PHENERGAN) 12.5 MG tablet Take 1 or 2 tabs as needed for nausea up to three times a day 01/31/18   [provider]  Topiramate ER (TROKENDI XR) 200 MG CP24 TAKE ONE CAPSULE (200 MG DOSE) BY MOUTH DAILY. 10/04/17   [provider]  verapamil (VERELAN) 100 MG 24 hr capsule Take by mouth. 12/22/14 09/18/19  [provider]    Allergies    Acetaminophen, Pyridium [phenazopyridine hcl], and Sulfonamide derivatives  Review of Systems   Review of Systems  Constitutional: Negative for chills and fever.  HENT: Negative for ear pain and sore throat.   Eyes: Negative for pain and visual disturbance.  Respiratory: Positive for chest tightness and shortness of breath. Negative for cough.   Cardiovascular: Positive for chest pain. Negative for palpitations.  Gastrointestinal: Negative for abdominal pain and vomiting.  Genitourinary: Negative for dysuria and hematuria.  Musculoskeletal: Negative for arthralgias and back pain.  Skin: Negative for color change and rash.  Neurological: Negative for seizures and syncope.  All other systems reviewed and are negative.   Physical Exam Updated Vital Signs BP 112/69   Temp 98.7 F (37.1 C) (Oral)   Resp 17   Ht 6' (1.829 m)   Wt 90.7 kg   SpO2 100%   BMI 27.12 kg/m   Physical Exam Vitals and nursing note reviewed.  Constitutional:      General: She is not in acute distress.    Appearance: She is well-developed.  HENT:     Head: Normocephalic and atraumatic.  Eyes:     Conjunctiva/sclera: Conjunctivae normal.  Cardiovascular:     Rate and Rhythm: Normal rate and regular rhythm.     Heart sounds: Normal heart sounds. No murmur.  Pulmonary:     Effort: Pulmonary effort is normal. No respiratory distress.     Breath sounds: Normal breath sounds.  Chest:     Chest wall: No mass, deformity or crepitus.  Abdominal:     Palpations: Abdomen is  soft.     Tenderness: There is no abdominal tenderness.  Musculoskeletal:     Cervical back: Neck supple.     Right lower leg: No edema.     Left lower leg: No edema.  Skin:    General: Skin is warm and dry.  Neurological:     Mental Status: She is alert.     ED Results / Procedures / Treatments   Labs (all labs ordered are listed, but only abnormal results are displayed) Labs Reviewed - No data to display  EKG None  Radiology No results found.  Procedures Procedures (including critical care time)  Medications Ordered in ED Medications - No data to display  ED Course  I have reviewed the triage vital  signs and the nursing notes.  Pertinent labs & imaging results that were available during my care of the patient were reviewed by me and considered in my medical decision making (see chart for details).    MDM Rules/Calculators/A&P                      39 year old lady presenting to ER with new onset chest pain.  On exam patient noted to be well-appearing, normal vital signs.  Given her recent COVID-19 infection, checked D-dimer which was negative.  Given normal dimer, normal vital signs, highly doubt pulmonary embolism.  EKG without acute ischemic changes, troponin x2 within normal limits.  CXR negative.  Suspect more likely MSK in etiology.  Pain controlled in ER, recommended follow-up with primary doctor this week.  Reviewed return precautions, discharged home with husband.    After the discussed management above, the patient was determined to be safe for discharge.  The patient was in agreement with this plan and all questions regarding their care were answered.  ED return precautions were discussed and the patient will return to the ED with any significant worsening of condition.   Final Clinical Impression(s) / ED Diagnoses Final diagnoses:  Chest pain, unspecified type    Rx / DC Orders ED Discharge Orders    None       Lucrezia Starch, MD 12/23/19 [phone]

## 2019-12-22 NOTE — ED Triage Notes (Signed)
Pt c/o mid chest pain that radiates to left side. Pt reports shortness of breath, N/V. Pt had COVID in January and received 1 COVID Vaccine 3 weeks ago.

## 2019-12-22 NOTE — ED Notes (Signed)
ED Provider at bedside. 

## 2019-12-22 NOTE — Discharge Instructions (Addendum)
Please follow-up with your primary doctor regarding the chest pain you are experiencing today.  Recommend Tylenol Motrin as needed for pain control.  Return to ER if your pain worsens, you develop fever, worsening difficulty breathing or other new concerning symptom.

## 2020-07-21 ENCOUNTER — Emergency Department: Payer: No Typology Code available for payment source

## 2020-07-21 ENCOUNTER — Encounter (HOSPITAL_BASED_OUTPATIENT_CLINIC_OR_DEPARTMENT_OTHER): Payer: Self-pay | Admitting: Emergency Medicine

## 2020-07-21 ENCOUNTER — Emergency Department (HOSPITAL_BASED_OUTPATIENT_CLINIC_OR_DEPARTMENT_OTHER): Payer: No Typology Code available for payment source

## 2020-07-21 ENCOUNTER — Other Ambulatory Visit: Payer: Self-pay

## 2020-07-21 ENCOUNTER — Emergency Department (HOSPITAL_BASED_OUTPATIENT_CLINIC_OR_DEPARTMENT_OTHER)
Admission: EM | Admit: 2020-07-21 | Discharge: 2020-07-21 | Disposition: A | Discharge status: Home / Self Care | Payer: No Typology Code available for payment source | Attending: Emergency Medicine | Admitting: Emergency Medicine

## 2020-07-21 DIAGNOSIS — R079 Chest pain, unspecified: Secondary | ICD-10-CM | POA: Insufficient documentation

## 2020-07-21 DIAGNOSIS — Z8616 Personal history of COVID-19: Secondary | ICD-10-CM | POA: Diagnosis not present

## 2020-07-21 DIAGNOSIS — Z87891 Personal history of nicotine dependence: Secondary | ICD-10-CM | POA: Diagnosis not present

## 2020-07-21 DIAGNOSIS — R935 Abnormal findings on diagnostic imaging of other abdominal regions, including retroperitoneum: Secondary | ICD-10-CM | POA: Insufficient documentation

## 2020-07-21 LAB — BASIC METABOLIC PANEL
Anion gap: 10 (ref 5–15)
BUN: 16 mg/dL (ref 6–20)
CO2: 21 mmol/L — ABNORMAL LOW (ref 22–32)
Calcium: 8.8 mg/dL — ABNORMAL LOW (ref 8.9–10.3)
Chloride: 108 mmol/L (ref 98–111)
Creatinine, Ser: 0.97 mg/dL (ref 0.44–1.00)
GFR, Estimated: >60 mL/min (ref >60)
Glucose, Bld: 85 mg/dL (ref 70–99)
Potassium: 3.9 mmol/L (ref 3.5–5.1)
Sodium: 139 mmol/L (ref 135–145)

## 2020-07-21 LAB — CBC
HCT: 41.5 % (ref 36.0–46.0)
Hemoglobin: 13.8 g/dL (ref 12.0–15.0)
MCH: 28.8 pg (ref 26.0–34.0)
MCHC: 33.3 g/dL (ref 30.0–36.0)
MCV: 86.6 fL (ref 80.0–100.0)
Platelets: 214 10*3/uL (ref 150–400)
RBC: 4.79 MIL/uL (ref 3.87–5.11)
RDW: 13.1 % (ref 11.5–15.5)
WBC: 6.8 10*3/uL (ref 4.0–10.5)
nRBC: 0 % (ref 0.0–0.2)

## 2020-07-21 LAB — TROPONIN I (HIGH SENSITIVITY)
Troponin I (High Sensitivity): <2 ng/L (ref <18)
Troponin I (High Sensitivity): <2 ng/L (ref <18)

## 2020-07-21 LAB — HEPATIC FUNCTION PANEL
ALT: 24 U/L (ref 0–44)
AST: 20 U/L (ref 15–41)
Albumin: 4 g/dL (ref 3.5–5.0)
Alkaline Phosphatase: 46 U/L (ref 38–126)
Bilirubin, Direct: <0.1 mg/dL (ref 0.0–0.2)
Total Bilirubin: 0.3 mg/dL (ref 0.3–1.2)
Total Protein: 7.1 g/dL (ref 6.5–8.1)

## 2020-07-21 LAB — LIPASE, BLOOD: Lipase: 57 U/L — ABNORMAL HIGH (ref 11–51)

## 2020-07-21 LAB — D-DIMER, QUANTITATIVE: D-Dimer, Quant: <0.27 ug/mL-FEU (ref 0.00–0.50)

## 2020-07-21 MED ORDER — ONDANSETRON HCL 4 MG/2ML IJ SOLN
4.0000 mg | Freq: Once | INTRAMUSCULAR | Status: AC
  Administered 2020-07-21: 4 mg via INTRAVENOUS
  Filled 2020-07-21: qty 2

## 2020-07-21 MED ORDER — FENTANYL CITRATE (PF) 100 MCG/2ML IJ SOLN
50.0000 ug | Freq: Once | INTRAMUSCULAR | Status: DC
  Filled 2020-07-21: qty 2

## 2020-07-21 MED ORDER — FAMOTIDINE IN NACL 20-0.9 MG/50ML-% IV SOLN
20.0000 mg | Freq: Once | INTRAVENOUS | Status: DC
  Filled 2020-07-21: qty 50

## 2020-07-21 MED ORDER — MORPHINE SULFATE (PF) 4 MG/ML IV SOLN
4.0000 mg | Freq: Once | INTRAVENOUS | Status: AC
  Administered 2020-07-21: 4 mg via INTRAVENOUS
  Filled 2020-07-21: qty 1

## 2020-07-21 MED ORDER — IOHEXOL 350 MG/ML SOLN
100.0000 mL | Freq: Once | INTRAVENOUS | Status: AC | PRN
  Administered 2020-07-21: 100 mL via INTRAVENOUS

## 2020-07-21 MED ORDER — PANTOPRAZOLE SODIUM 40 MG IV SOLR
40.0000 mg | Freq: Once | INTRAVENOUS | Status: AC
  Administered 2020-07-21: 40 mg via INTRAVENOUS
  Filled 2020-07-21: qty 40

## 2020-07-21 MED ORDER — ASPIRIN EC 81 MG PO TBEC
324.0000 mg | DELAYED_RELEASE_TABLET | Freq: Once | ORAL | Status: AC
  Administered 2020-07-21: 324 mg via ORAL
  Filled 2020-07-21: qty 4

## 2020-07-21 NOTE — ED Notes (Signed)
Pt. On monitor. 

## 2020-07-21 NOTE — ED Notes (Signed)
Review D/C papers with pt, pt states understanding, pt denies questions at this time. 

## 2020-07-21 NOTE — ED Provider Notes (Addendum)
°  Physical Exam  BP 110/73    Pulse 81    Temp 98.8 F (37.1 C) (Oral)    Resp 15    Ht 6' (1.829 m)    Wt 88.5 kg    SpO2 100%    BMI 26.45 kg/m   Physical Exam  ED Course/Procedures   Clinical Course as of Jul 21 1625  Tue Jul 21, 2020  1044 Chest x-ray interpreted by me as no acute infiltrates.   [MB]  1225 Went back and reassessed patient.  She says her pain is down to a 6.  She is also noted that she has had some pain after eating in her abdomen and was now with we did any testing to look at her gallbladder.  She said she has had some test in the past that have been equivocal.  We will add on some LFTs.   [MB]    Clinical Course User Index [MB] Terrilee Files, MD    Procedures  MDM  16: 27 contacted CT to follow-up on results.  CT not resulted yet.  CT results are normal.  No intrathoracic or intra-abdominal anomalies identified.  Patient did continue to have pain that was somewhat epigastric\lower chest from left to right.  I offered to add Pepcid and fentanyl.  Patient declined, she wished to go home and did not want the medications to make her drowsy.  She is counseled to follow-up with her GI doctor ASAP.  Return precautions reviewed.      Arby Barrette, MD 07/21/20 1627    Arby Barrette, MD 07/21/20 [phone]

## 2020-07-21 NOTE — ED Provider Notes (Signed)
[facility] EMERGENCY DEPARTMENT Provider Note   CSN: [id number] Arrival date & time: 07/21/20  4097     History Chief Complaint  Patient presents with  . Chest Pain    Kristin Jarvis is a 39 y.o. female.  She has a history of IBD and had a Covid infection in the spring.  She noticed some soreness in her neck last night.  Today while at work she experienced severe left-sided chest pain pressure that radiated into her left arm.  Denies shortness of breath.  Was associated with some nausea.  No diaphoresis.  She had a similar pain back when she had Covid but they landed on some lung scarring.  She has had her IBD flaring for the last few days giving her abdominal pain.  No known cardiac disease.  The history is provided by the patient.  Chest Pain Pain location:  L chest Pain quality: aching and pressure   Pain radiates to:  L arm Pain severity:  Severe Onset quality:  Sudden Timing:  Constant Progression:  Unchanged Chronicity:  New Context: at rest   Relieved by:  None tried Worsened by:  Nothing Ineffective treatments:  None tried Associated symptoms: abdominal pain and back pain   Associated symptoms: no fever, no headache, no heartburn and no shortness of breath   Risk factors: no aortic disease, no coronary artery disease, no diabetes mellitus, no high cholesterol and no immobilization        Past Medical History:  Diagnosis Date  . COVID-19   . IBS (irritable bowel syndrome)   . Renal stone     Patient Active Problem List   Diagnosis Date Noted  . DYSPNEA 08/26/2008  . CHEST PAIN-UNSPECIFIED 08/26/2008  . HEADACHE, CHRONIC, HX OF 08/26/2008    Past Surgical History:  Procedure Laterality Date  . ABDOMINAL HYSTERECTOMY    . COLONOSCOPY    . INCONTINENCE SURGERY    . INCONTINENCE SURGERY    . SHOULDER SURGERY    . TONSILLECTOMY       OB History   No obstetric history on file.     History reviewed. No pertinent family history.  Social  History   Tobacco Use  . Smoking status: Former Games developer  . Smokeless tobacco: Never Used  Vaping Use  . Vaping Use: Never used  Substance Use Topics  . Alcohol use: Yes  . Drug use: Not Currently    Home Medications Prior to Admission medications   Medication Sig Start Date End Date Taking? Authorizing Provider  Alum & Mag Hydroxide-Simeth (GI COCKTAIL) SUSP suspension Take 30 mLs by mouth 2 (two) times daily as needed for indigestion. Shake well. 10/28/18   Garlon Hatchet, PA-C  Ascorbic Acid (VITAMIN C) 100 MG tablet Take by mouth.    [provider]  Ascorbic Acid (VITAMIN C) 500 MG CHEW Chew by mouth.    [provider]  b complex vitamins tablet Take by mouth.    [provider]  baclofen (LIORESAL) 10 MG tablet Take by mouth. 05/15/15   [provider]  ciprofloxacin (CIPRO) 500 MG tablet Take 1 tablet (500 mg total) by mouth 2 (two) times daily. 04/10/16   Renne Crigler, PA-C  dicyclomine (BENTYL) 20 MG tablet Take 1 tablet (20 mg total) by mouth 2 (two) times daily. 10/28/18   Garlon Hatchet, PA-C  eletriptan (RELPAX) 40 MG tablet Take by mouth. 07/06/17   [provider]  famotidine (PEPCID) 20 MG tablet Take by  mouth. 07/01/16   [provider]  Galcanezumab-gnlm 120 MG/ML SOAJ Inject into the skin. 04/27/18   [provider]  HYDROcodone-acetaminophen (NORCO/VICODIN) 5-325 MG tablet Take 1-2 tablets by mouth every 6 (six) hours as needed. 04/09/16   Santiago Glad, PA-C  ibuprofen (ADVIL,MOTRIN) 800 MG tablet Take 1 tablet (800 mg total) by mouth 3 (three) times daily. 07/19/15   Rancour, Jeannett Senior, MD  Magnesium 250 MG TABS Take by mouth.    [provider]  Multiple Vitamins-Minerals (MULTIVITAMIN ADULTS) TABS Take by mouth.    [provider]  norethindrone-ethinyl estradiol (MICROGESTIN,JUNEL,LOESTRIN) 1-20 MG-MCG tablet TAKE 1 TABLET BY MOUTH DAILY. PT TAKES THIS IN A CONTINOUS FASHION SKIPPING SUGAR  PILLS. 05/29/18   [provider]  omeprazole (PRILOSEC) 40 MG capsule Take by mouth. 05/09/14   [provider]  ondansetron (ZOFRAN) 4 MG tablet Take 1 tablet (4 mg total) by mouth every 6 (six) hours. 04/09/16   Santiago Glad, PA-C  oxyCODONE (OXY IR/ROXICODONE) 5 MG immediate release tablet Take 1 tablet (5 mg total) by mouth every 4 (four) hours as needed for severe pain. 10/28/18   Garlon Hatchet, PA-C  polyethylene glycol [facility] Ethelene Hal) packet Take by mouth. 12/14/17   [provider]  promethazine (PHENERGAN) 12.5 MG tablet Take 1 or 2 tabs as needed for nausea up to three times a day 01/31/18   [provider]  Topiramate ER (TROKENDI XR) 200 MG CP24 TAKE ONE CAPSULE (200 MG DOSE) BY MOUTH DAILY. 10/04/17   [provider]  verapamil (VERELAN) 100 MG 24 hr capsule Take by mouth. 12/22/14 09/18/19  [provider]    Allergies    Acetaminophen, Pyridium [phenazopyridine hcl], and Sulfonamide derivatives  Review of Systems   Review of Systems  Constitutional: Negative for fever.  HENT: Negative for sore throat.   Eyes: Negative for visual disturbance.  Respiratory: Negative for shortness of breath.   Cardiovascular: Positive for chest pain.  Gastrointestinal: Positive for abdominal pain. Negative for heartburn.  Genitourinary: Negative for dysuria.  Musculoskeletal: Positive for back pain and neck pain.  Skin: Negative for rash.  Neurological: Negative for headaches.    Physical Exam Updated Vital Signs BP 113/81 (BP Location: Right Arm)   Pulse 84   Temp 98.8 F (37.1 C) (Oral)   Resp 20   Ht 6' (1.829 m)   Wt 88.5 kg   SpO2 100%   BMI 26.45 kg/m   Physical Exam Vitals and nursing note reviewed.  Constitutional:      General: She is not in acute distress.    Appearance: She is well-developed.  HENT:     Head: Normocephalic and atraumatic.  Eyes:     Conjunctiva/sclera: Conjunctivae normal.  Cardiovascular:      Rate and Rhythm: Normal rate and regular rhythm.     Heart sounds: Normal heart sounds. No murmur heard.   Pulmonary:     Effort: Pulmonary effort is normal. No respiratory distress.     Breath sounds: Normal breath sounds.  Abdominal:     Palpations: Abdomen is soft.     Tenderness: There is no abdominal tenderness.  Musculoskeletal:        General: Normal range of motion.     Cervical back: Neck supple.     Right lower leg: No tenderness. No edema.     Left lower leg: No tenderness. No edema.  Skin:    General: Skin is warm and dry.  Capillary Refill: Capillary refill takes less than 2 seconds.  Neurological:     General: No focal deficit present.     Mental Status: She is alert.     ED Results / Procedures / Treatments   Labs (all labs ordered are listed, but only abnormal results are displayed) Labs Reviewed  BASIC METABOLIC PANEL - Abnormal; Notable for the following components:      Result Value   CO2 21 (*)    Calcium 8.8 (*)    All other components within normal limits  LIPASE, BLOOD - Abnormal; Notable for the following components:   Lipase 57 (*)    All other components within normal limits  CBC  D-DIMER, QUANTITATIVE (NOT AT [facility])  HEPATIC FUNCTION PANEL  TROPONIN I (HIGH SENSITIVITY)  TROPONIN I (HIGH SENSITIVITY)    EKG EKG Interpretation  Date/Time:  Tuesday July 21 2020 10:21:02 EDT Ventricular Rate:  76 PR Interval:    QRS Duration: 113 QT Interval:  426 QTC Calculation: 479 R Axis:   89 Text Interpretation: Sinus rhythm Borderline intraventricular conduction delay Baseline wander in lead(s) V2 No significant change since prior 3/21 Confirmed by Meridee ScoreButler, Dewan Emond [phone]([id number]) on 07/21/2020 10:31:13 AM   Radiology DG Chest 2 View  Result Date: 07/21/2020 CLINICAL DATA:  Chest pain EXAM: CHEST - 2 VIEW COMPARISON:  12/22/2019 FINDINGS: The heart size and mediastinal contours are within normal limits. Both lungs are clear. The visualized  skeletal structures are unremarkable. Surgical anchors in the right labrum. IMPRESSION: No active cardiopulmonary disease. Electronically Signed   By: Marlan Palauharles  Clark M.D.   On: 07/21/2020 10:45   CT Angio Chest/Abd/Pel for Dissection W and/or W/WO  Result Date: 07/21/2020 CLINICAL DATA:  Acute left neck and chest pain radiating to the back, concern for dissection EXAM: CT ANGIOGRAPHY CHEST, ABDOMEN AND PELVIS TECHNIQUE: Non-contrast CT of the chest was initially obtained. Multidetector CT imaging through the chest, abdomen and pelvis was performed using the standard protocol during bolus administration of intravenous contrast. Multiplanar reconstructed images and MIPs were obtained and reviewed to evaluate the vascular anatomy. CONTRAST:  100mL OMNIPAQUE IOHEXOL 350 MG/ML SOLN COMPARISON:  None. FINDINGS: CTA CHEST FINDINGS Cardiovascular: Intact thoracic aorta. Pulsation artifact noted of the ascending aorta. Negative for dissection, aneurysm or acute vascular process. Patent 3 vessel arch anatomy. No mediastinal hemorrhage or hematoma. Central venous structures are patent. No veno-occlusive process. Central pulmonary arteries are patent. No central filling defect or pulmonary embolus. Normal heart size.  No pericardial effusion. Mediastinum/Nodes: No enlarged mediastinal, hilar, or axillary lymph nodes. Thyroid gland, trachea, and esophagus demonstrate no significant findings. Lungs/Pleura: Minor dependent basilar atelectasis. No acute airspace process, collapse, or consolidation. Negative for edema or interstitial process. Trachea and central airways are patent. No pleural abnormality, effusion or pneumothorax. Musculoskeletal: No chest wall abnormality. No acute or significant osseous findings. Review of the MIP images confirms the above findings. CTA ABDOMEN AND PELVIS FINDINGS VASCULAR Aorta: Normal caliber aorta without aneurysm, dissection, vasculitis or significant stenosis. Celiac: Patent without  evidence of aneurysm, dissection, vasculitis or significant stenosis. SMA: Patent without evidence of aneurysm, dissection, vasculitis or significant stenosis. Patent replaced right hepatic artery off of the proximal SMA noted. Renals: Renal arteries are patent bilaterally. Patent right lower pole accessory renal artery demonstrated. IMA: IMA remains patent off the distal aorta including its branches Inflow: Patent without evidence of aneurysm, dissection, vasculitis or significant stenosis. Veins: Dedicated venous imaging not performed. Circumaortic left renal vein noted. Review of the MIP images  confirms the above findings. NON-VASCULAR Hepatobiliary: There are several scattered subcentimeter hyperenhancing foci in the liver parenchyma bilaterally some of these are peripheral, but several are central within the liver. This is a single phase arterial exam. Although these may represent benign transient hepatic attenuation defects these remain indeterminate by single-phase imaging. Recommend follow-up nonemergent MRI without and with contrast within the next 3 months for further evaluation. No biliary dilatation. Gallbladder and biliary system unremarkable. Common bile duct nondilated. Pancreas: Unremarkable. No pancreatic ductal dilatation or surrounding inflammatory changes. Spleen: Normal in size without focal abnormality. Adrenals/Urinary Tract: Subcentimeter right kidney upper pole hypodense cyst noted. No other renal abnormality or hydronephrosis. No acute obstructive uropathy. Ureters are symmetric and decompressed. No hydroureter or ureteral calculus. No bladder abnormality. Normal appearing adrenal glands. Stomach/Bowel: Stomach is within normal limits. Appendix appears normal. No evidence of bowel wall thickening, distention, or inflammatory changes. Lymphatic: No bulky adenopathy. Reproductive: Previous hysterectomy. No adnexal abnormality. No pelvic free fluid or fluid collection. Other: No abdominal wall  hernia or abnormality. No abdominopelvic ascites. Musculoskeletal: No acute or significant osseous findings. Review of the MIP images confirms the above findings. IMPRESSION: Intact aorta. Negative for acute dissection or other acute vascular finding in the chest abdomen or pelvis. Negative for pulmonary embolus. No other acute intrathoracic, abdominal, or pelvic finding. Scattered subcentimeter hyperenhancing foci in the liver parenchyma by single phase arterial imaging, incompletely characterized. See above comment. Favored to be benign. Recommend nonemergent follow-up MRI without and with contrast within 3 months. Electronically Signed   By: Judie Petit.  Shick M.D.   On: 07/21/2020 16:57    Procedures Procedures (including critical care time)  Medications Ordered in ED Medications  fentaNYL (SUBLIMAZE) injection 50 mcg (has no administration in time range)  famotidine (PEPCID) IVPB 20 mg premix (has no administration in time range)  aspirin EC tablet 324 mg (324 mg Oral Given 07/21/20 1100)  morphine 4 MG/ML injection 4 mg (4 mg Intravenous Given 07/21/20 1100)  ondansetron (ZOFRAN) injection 4 mg (4 mg Intravenous Given 07/21/20 1100)  morphine 4 MG/ML injection 4 mg (4 mg Intravenous Given 07/21/20 1303)  iohexol (OMNIPAQUE) 350 MG/ML injection 100 mL (100 mLs Intravenous Contrast Given 07/21/20 1329)  pantoprazole (PROTONIX) injection 40 mg (40 mg Intravenous Given 07/21/20 1530)    ED Course  I have reviewed the triage vital signs and the nursing notes.  Pertinent labs & imaging results that were available during my care of the patient were reviewed by me and considered in my medical decision making (see chart for details).  Clinical Course as of Jul 21 1713  Tue Jul 21, 2020  1044 Chest x-ray interpreted by me as no acute infiltrates.   [MB]  1225 Went back and reassessed patient.  She says her pain is down to a 6.  She is also noted that she has had some pain after eating in her abdomen  and was now with we did any testing to look at her gallbladder.  She said she has had some test in the past that have been equivocal.  We will add on some LFTs.   [MB]    Clinical Course User Index [MB] Terrilee Files, MD   MDM Rules/Calculators/A&P                         This patient complains of chest pain left-sided radiating through to her back and into her neck and abdomen; this involves an extensive number  of treatment Options and is a complaint that carries with it a high risk of complications and Morbidity. The differential includes ACS, dissection, pneumonia, pneumothorax, PE, musculoskeletal, GERD, cholelithiasis  I ordered, reviewed and interpreted labs, which included CBC with normal hemoglobin normal white count, chemistries fairly normal other than mildly low bicarb, LFTs normal, lipase mildly elevated, D-dimer negative, troponin negative I ordered medication IV pain and nausea medication with some improvement in her symptoms I ordered imaging studies which included chest x-ray and CT angio chest abdomen and pelvis and I independently    visualized and interpreted imaging which showed no acute infiltrates.  CT results are pending at time of signout Additional history obtained from patient significant other Previous records obtained and reviewed in epic, no recent admissions   After the interventions stated above, I reevaluated the patient and found patient have improved but not resolved pain.  She is signed out to Dr. Clarice Pole with plan to follow-up on delta troponin and results of CT imaging.  Likely if we can get her pain under control and there is no acute findings she would be able to be discharged to follow-up with her primary care doctor.   Final Clinical Impression(s) / ED Diagnoses Final diagnoses:  Nonspecific chest pain    Rx / DC Orders ED Discharge Orders    None       Terrilee Files, MD 07/21/20 1718

## 2020-07-21 NOTE — Discharge Instructions (Addendum)
1.  Follow-up with your gastroenterologist as soon as possible. 2.  Continue your omeprazole and Pepcid. 3.  Eat a very low-fat diet of very mild foods.  Stay hydrated with clear liquids.  Avoid any alcohol and any spicy or acidic food. 4.  Return to the emergency department if you develop fever, worsening symptoms or other concerning changes.

## 2020-07-21 NOTE — ED Triage Notes (Signed)
Pt arrives pov with spouse with c/o left side CP with left arm radiation that started 0630 today. Pt also reports left side neck pain that started last night. Pt deneis shob

## 2020-11-22 IMAGING — DX DG CHEST 2V
2 series · 2 of 2 positions shown · non-contrast
Comparison: November 06, 2019.

CLINICAL DATA: Chest pain.

EXAM:
CHEST - 2 VIEW

[chest lat]
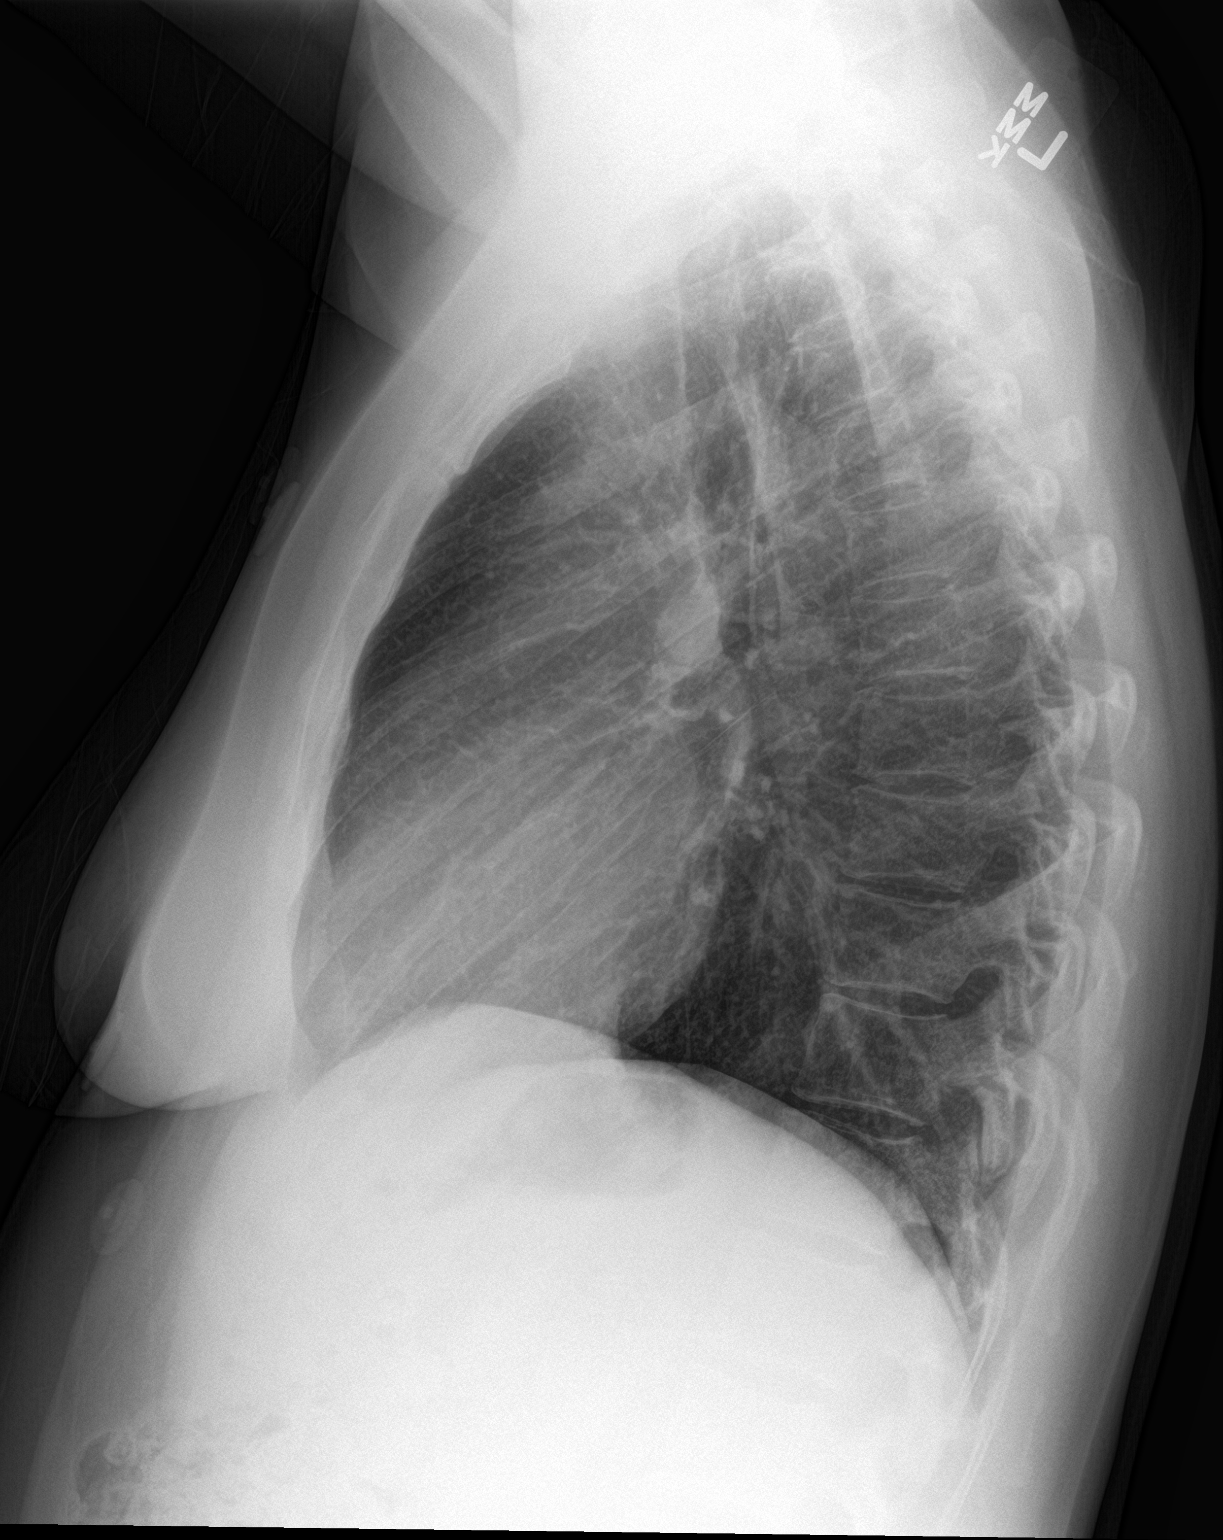

[chest pa]
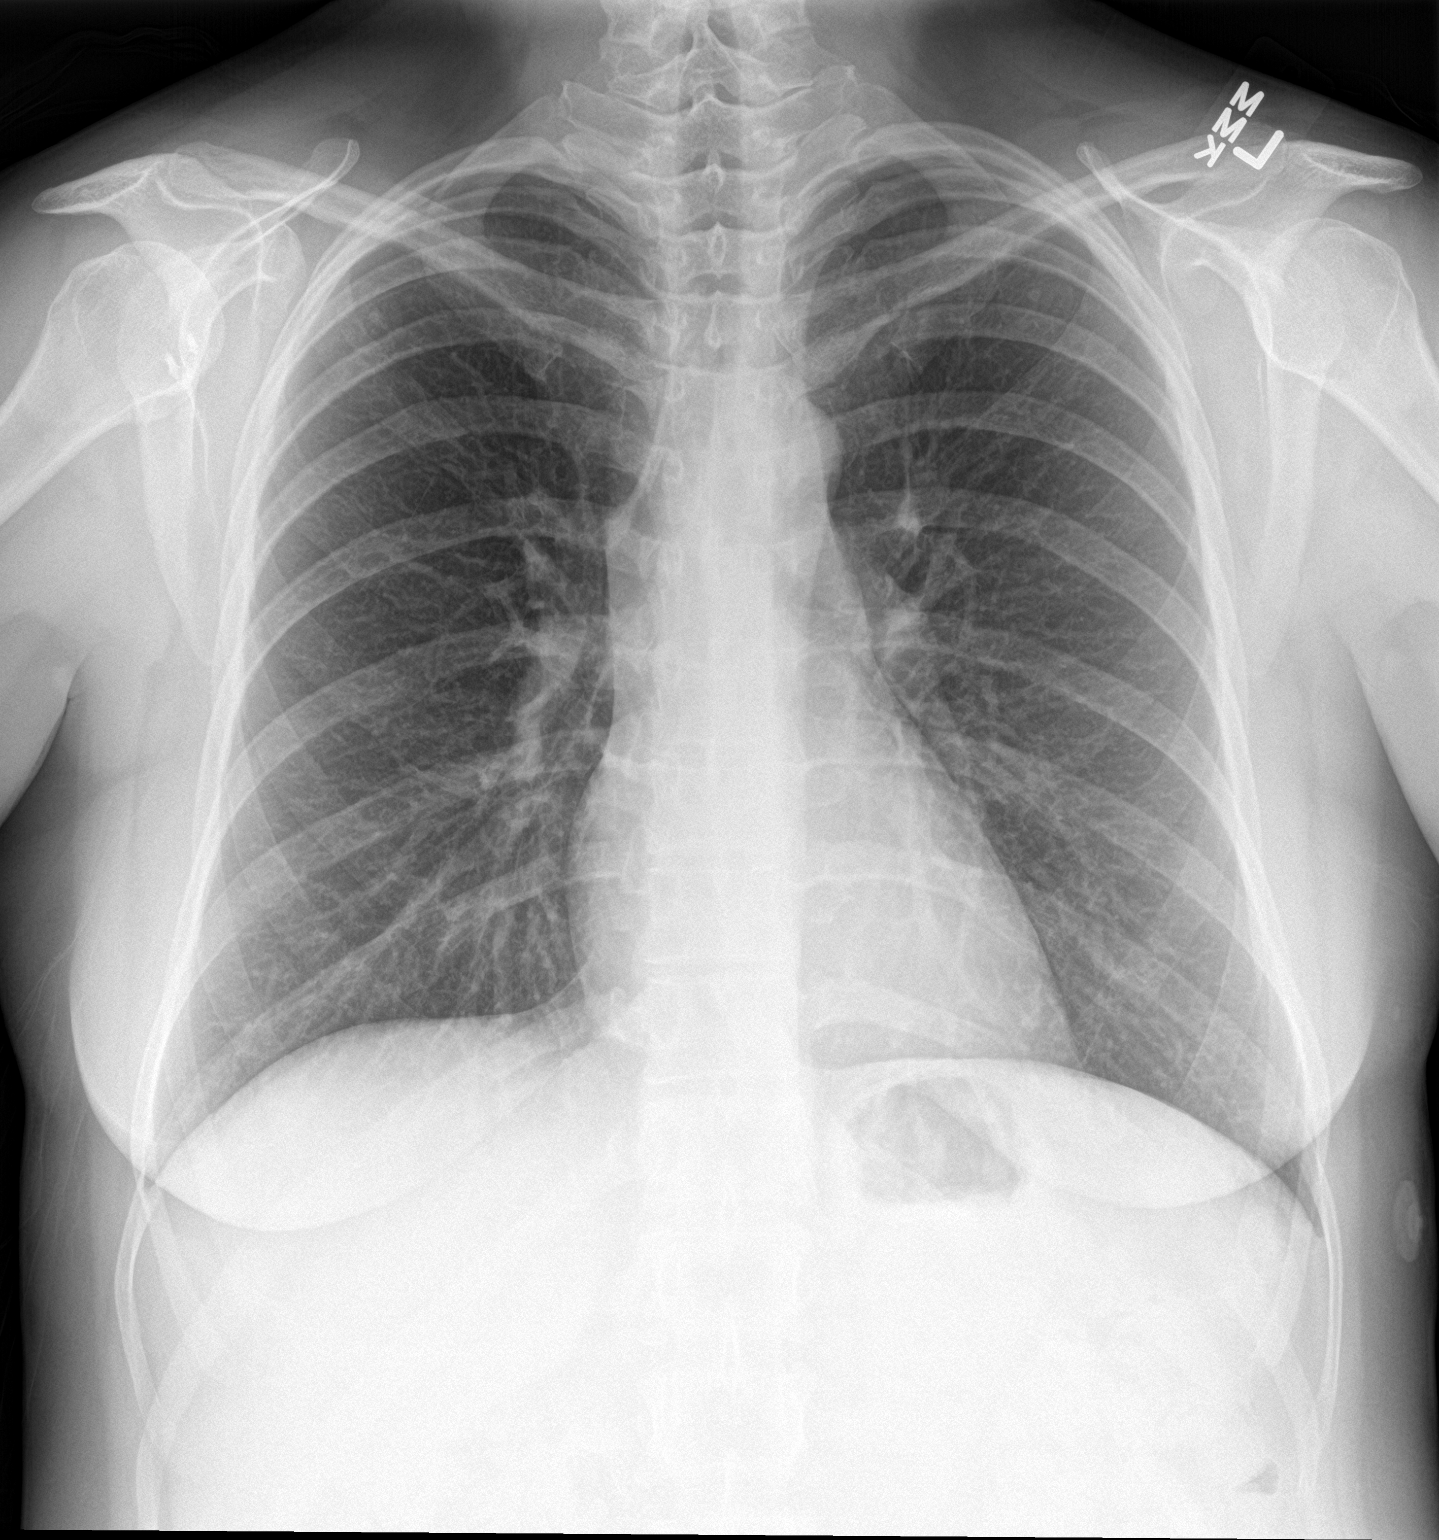

[2 of 2 positions shown; findings below may reference images not displayed]

FINDINGS: The heart size and mediastinal contours are within normal limits.
Both lungs are clear. No pneumothorax or pleural effusion is noted.
The visualized skeletal structures are unremarkable.
IMPRESSION: No active cardiopulmonary disease.

## 2021-06-17 ENCOUNTER — Other Ambulatory Visit: Payer: Self-pay

## 2021-06-17 ENCOUNTER — Encounter (HOSPITAL_BASED_OUTPATIENT_CLINIC_OR_DEPARTMENT_OTHER): Payer: Self-pay | Admitting: *Deleted

## 2021-06-17 ENCOUNTER — Emergency Department (HOSPITAL_BASED_OUTPATIENT_CLINIC_OR_DEPARTMENT_OTHER): Payer: No Typology Code available for payment source

## 2021-06-17 ENCOUNTER — Emergency Department (HOSPITAL_BASED_OUTPATIENT_CLINIC_OR_DEPARTMENT_OTHER)
Admission: EM | Admit: 2021-06-17 | Discharge: 2021-06-17 | Disposition: A | Discharge status: Home / Self Care | Payer: No Typology Code available for payment source | Attending: Emergency Medicine | Admitting: Emergency Medicine

## 2021-06-17 DIAGNOSIS — R42 Dizziness and giddiness: Secondary | ICD-10-CM | POA: Diagnosis not present

## 2021-06-17 DIAGNOSIS — R1084 Generalized abdominal pain: Secondary | ICD-10-CM | POA: Diagnosis not present

## 2021-06-17 DIAGNOSIS — Z87891 Personal history of nicotine dependence: Secondary | ICD-10-CM | POA: Insufficient documentation

## 2021-06-17 DIAGNOSIS — R110 Nausea: Secondary | ICD-10-CM | POA: Insufficient documentation

## 2021-06-17 DIAGNOSIS — Z8616 Personal history of COVID-19: Secondary | ICD-10-CM | POA: Diagnosis not present

## 2021-06-17 DIAGNOSIS — R109 Unspecified abdominal pain: Secondary | ICD-10-CM | POA: Diagnosis present

## 2021-06-17 LAB — CBC WITH DIFFERENTIAL/PLATELET
Abs Immature Granulocytes: 0.01 10*3/uL (ref 0.00–0.07)
Basophils Absolute: 0 10*3/uL (ref 0.0–0.1)
Basophils Relative: 0 %
Eosinophils Absolute: 0.1 10*3/uL (ref 0.0–0.5)
Eosinophils Relative: 2 %
HCT: 38.1 % (ref 36.0–46.0)
Hemoglobin: 13.1 g/dL (ref 12.0–15.0)
Immature Granulocytes: 0 %
Lymphocytes Relative: 35 %
Lymphs Abs: 1.8 10*3/uL (ref 0.7–4.0)
MCH: 29.8 pg (ref 26.0–34.0)
MCHC: 34.4 g/dL (ref 30.0–36.0)
MCV: 86.6 fL (ref 80.0–100.0)
Monocytes Absolute: 0.4 10*3/uL (ref 0.1–1.0)
Monocytes Relative: 7 %
Neutro Abs: 2.9 10*3/uL (ref 1.7–7.7)
Neutrophils Relative %: 56 %
Platelets: 215 10*3/uL (ref 150–400)
RBC: 4.4 MIL/uL (ref 3.87–5.11)
RDW: 13 % (ref 11.5–15.5)
WBC: 5.2 10*3/uL (ref 4.0–10.5)
nRBC: 0 % (ref 0.0–0.2)

## 2021-06-17 LAB — URINALYSIS, ROUTINE W REFLEX MICROSCOPIC
Bilirubin Urine: NEGATIVE
Glucose, UA: NEGATIVE mg/dL
Hgb urine dipstick: NEGATIVE
Ketones, ur: NEGATIVE mg/dL
Leukocytes,Ua: NEGATIVE
Nitrite: NEGATIVE
Protein, ur: NEGATIVE mg/dL
Specific Gravity, Urine: 1.015 (ref 1.005–1.030)
pH: 7 (ref 5.0–8.0)

## 2021-06-17 LAB — LIPASE, BLOOD: Lipase: 47 U/L (ref 11–51)

## 2021-06-17 LAB — COMPREHENSIVE METABOLIC PANEL
ALT: 27 U/L (ref 0–44)
AST: 21 U/L (ref 15–41)
Albumin: 3.8 g/dL (ref 3.5–5.0)
Alkaline Phosphatase: 42 U/L (ref 38–126)
Anion gap: 8 (ref 5–15)
BUN: 13 mg/dL (ref 6–20)
CO2: 19 mmol/L — ABNORMAL LOW (ref 22–32)
Calcium: 8.9 mg/dL (ref 8.9–10.3)
Chloride: 111 mmol/L (ref 98–111)
Creatinine, Ser: 0.8 mg/dL (ref 0.44–1.00)
GFR, Estimated: >60 mL/min (ref >60)
Glucose, Bld: 103 mg/dL — ABNORMAL HIGH (ref 70–99)
Potassium: 3.8 mmol/L (ref 3.5–5.1)
Sodium: 138 mmol/L (ref 135–145)
Total Bilirubin: 0.2 mg/dL — ABNORMAL LOW (ref 0.3–1.2)
Total Protein: 6.6 g/dL (ref 6.5–8.1)

## 2021-06-17 MED ORDER — OXYCODONE-ACETAMINOPHEN 5-325 MG PO TABS: 1.0000 | ORAL_TABLET | Freq: Four times a day (QID) | ORAL | 0 refills | Status: DC | PRN

## 2021-06-17 MED ORDER — OXYCODONE HCL 5 MG PO TABS: 5.0000 mg | ORAL_TABLET | ORAL | 0 refills | Status: AC | PRN

## 2021-06-17 MED ORDER — HYDROMORPHONE HCL 1 MG/ML IJ SOLN
1.0000 mg | Freq: Once | INTRAMUSCULAR | Status: AC
  Administered 2021-06-17: 1 mg via INTRAVENOUS
  Filled 2021-06-17: qty 1

## 2021-06-17 MED ORDER — ONDANSETRON HCL 4 MG/2ML IJ SOLN
4.0000 mg | Freq: Once | INTRAMUSCULAR | Status: AC
  Administered 2021-06-17: 4 mg via INTRAVENOUS
  Filled 2021-06-17: qty 2

## 2021-06-17 NOTE — Discharge Instructions (Signed)
Follow-up with your primary doctor and your gastroenterologist.  If you would like to transfer care to a different GI office, you can try contacting either the [facility]power or [facility] offices.  Discuss potential biopsy with your primary doctor and GI.  Come back to ER if you develop worsening pain, vomiting, fever or other new concerning symptom.

## 2021-06-17 NOTE — ED Triage Notes (Signed)
Right mid quad pain x 3 weeks. She has already had an MRI and [place] that showed lesions on her liver. Her MD plans to do a liver biopsy. Hx of renal cyst and kidney stones on the right side.

## 2021-06-18 NOTE — ED Provider Notes (Signed)
[facility] EMERGENCY DEPARTMENT Provider Note   CSN: [id number] Arrival date & time: 06/17/21  1204     History Chief Complaint  Patient presents with   Abdominal Pain    Kristin Jarvis is a 40 y.o. female.  Presents to ER with concern for abdominal pain.  Patient states that she is struggled with abdominal pain, IBS for a very long time however this pain is relatively new.  Pain has been ongoing for the last 3 weeks, worse in her right side, right flank.  Sharp and stabbing, constant.  Some intermittent nausea.  Has been seen by gastroenterology.  Has lesions on her liver and states that her GI had recently arranged for MRI and likely will need biopsy.  Does endorse history of kidney stones.  This morning pain felt very severe while in the bathroom, felt lightheaded and had an episode of passing out.  Did not hit her head.  Did not suffer any trauma during the episode.  No bladder or bowel incontinence associated.  HPI     Past Medical History:  Diagnosis Date   COVID-19    IBS (irritable bowel syndrome)    Renal stone     Patient Active Problem List   Diagnosis Date Noted   DYSPNEA 08/26/2008   CHEST PAIN-UNSPECIFIED 08/26/2008   HEADACHE, CHRONIC, HX OF 08/26/2008    Past Surgical History:  Procedure Laterality Date   ABDOMINAL HYSTERECTOMY     COLONOSCOPY     INCONTINENCE SURGERY     INCONTINENCE SURGERY     SHOULDER SURGERY     TONSILLECTOMY       OB History   No obstetric history on file.     No family history on file.  Social History   Tobacco Use   Smoking status: Former   Smokeless tobacco: Never  Building services engineer Use: Never used  Substance Use Topics   Alcohol use: Yes   Drug use: Not Currently    Home Medications Prior to Admission medications   Medication Sig Start Date End Date Taking? Authorizing Provider  Alum & Mag Hydroxide-Simeth (GI COCKTAIL) SUSP suspension Take 30 mLs by mouth 2 (two) times daily as needed for  indigestion. Shake well. 10/28/18   Garlon Hatchet, PA-C  Ascorbic Acid (VITAMIN C) 100 MG tablet Take by mouth.    [provider]  Ascorbic Acid (VITAMIN C) 500 MG CHEW Chew by mouth.    [provider]  b complex vitamins tablet Take by mouth.    [provider]  baclofen (LIORESAL) 10 MG tablet Take by mouth. 05/15/15   [provider]  ciprofloxacin (CIPRO) 500 MG tablet Take 1 tablet (500 mg total) by mouth 2 (two) times daily. 04/10/16   Renne Crigler, PA-C  dicyclomine (BENTYL) 20 MG tablet Take 1 tablet (20 mg total) by mouth 2 (two) times daily. 10/28/18   Garlon Hatchet, PA-C  eletriptan (RELPAX) 40 MG tablet Take by mouth. 07/06/17   [provider]  famotidine (PEPCID) 20 MG tablet Take by mouth. 07/01/16   [provider]  Galcanezumab-gnlm 120 MG/ML SOAJ Inject into the skin. 04/27/18   [provider]  HYDROcodone-acetaminophen (NORCO/VICODIN) 5-325 MG tablet Take 1-2 tablets by mouth every 6 (six) hours as needed. 04/09/16   Santiago Glad, PA-C  ibuprofen (ADVIL,MOTRIN) 800 MG tablet Take 1 tablet (800 mg total) by mouth 3 (three) times daily. 07/19/15   Glynn Octave, MD  Magnesium 250 MG TABS  Take by mouth.    [provider]  Multiple Vitamins-Minerals (MULTIVITAMIN ADULTS) TABS Take by mouth.    [provider]  norethindrone-ethinyl estradiol (MICROGESTIN,JUNEL,LOESTRIN) 1-20 MG-MCG tablet TAKE 1 TABLET BY MOUTH DAILY. PT TAKES THIS IN A CONTINOUS FASHION SKIPPING SUGAR PILLS. 05/29/18   [provider]  omeprazole (PRILOSEC) 40 MG capsule Take by mouth. 05/09/14   [provider]  ondansetron (ZOFRAN) 4 MG tablet Take 1 tablet (4 mg total) by mouth every 6 (six) hours. 04/09/16   Santiago Glad, PA-C  oxyCODONE (OXY IR/ROXICODONE) 5 MG immediate release tablet Take 1 tablet (5 mg total) by mouth every 4 (four) hours as needed for up to 3 days for severe pain. 06/17/21 06/20/21   Milagros Loll, MD  polyethylene glycol [facility] Ethelene Hal) packet Take by mouth. 12/14/17   [provider]  promethazine (PHENERGAN) 12.5 MG tablet Take 1 or 2 tabs as needed for nausea up to three times a day 01/31/18   [provider]  Topiramate ER (TROKENDI XR) 200 MG CP24 TAKE ONE CAPSULE (200 MG DOSE) BY MOUTH DAILY. 10/04/17   [provider]  verapamil (VERELAN) 100 MG 24 hr capsule Take by mouth. 12/22/14 09/18/19  [provider]    Allergies    Acetaminophen, Pyridium [phenazopyridine hcl], and Sulfonamide derivatives  Review of Systems   Review of Systems  Constitutional:  Negative for chills and fever.  HENT:  Negative for ear pain and sore throat.   Eyes:  Negative for pain and visual disturbance.  Respiratory:  Negative for cough and shortness of breath.   Cardiovascular:  Negative for chest pain and palpitations.  Gastrointestinal:  Positive for abdominal pain. Negative for vomiting.  Genitourinary:  Positive for flank pain. Negative for dysuria and hematuria.  Musculoskeletal:  Negative for arthralgias and back pain.  Skin:  Negative for color change and rash.  Neurological:  Negative for seizures and syncope.  All other systems reviewed and are negative.  Physical Exam Updated Vital Signs BP 110/80   Pulse 81   Temp 98.6 F (37 C) (Oral)   Resp 12   Ht 6' (1.829 m)   Wt 88.5 kg   SpO2 96%   BMI 26.46 kg/m   Physical Exam Vitals and nursing note reviewed.  Constitutional:      General: She is not in acute distress.    Appearance: She is well-developed.  HENT:     Head: Normocephalic and atraumatic.  Eyes:     Conjunctiva/sclera: Conjunctivae normal.  Cardiovascular:     Rate and Rhythm: Normal rate and regular rhythm.     Heart sounds: No murmur heard. Pulmonary:     Effort: Pulmonary effort is normal. No respiratory distress.     Breath sounds: Normal breath sounds.  Abdominal:     Palpations: Abdomen is soft.      Tenderness: There is abdominal tenderness.     Comments: Tenderness to palpation in the right flank  Musculoskeletal:     Cervical back: Neck supple.  Skin:    General: Skin is warm and dry.  Neurological:     General: No focal deficit present.     Mental Status: She is alert.    ED Results / Procedures / Treatments   Labs (all labs ordered are listed, but only abnormal results are displayed) Labs Reviewed  COMPREHENSIVE METABOLIC PANEL - Abnormal; Notable for the following components:      Result Value   CO2 19 (*)  Glucose, Bld 103 (*)    Total Bilirubin 0.2 (*)    All other components within normal limits  CBC WITH DIFFERENTIAL/PLATELET  LIPASE, BLOOD  URINALYSIS, ROUTINE W REFLEX MICROSCOPIC    EKG EKG Interpretation  Date/Time:  Thursday June 17 2021 13:24:56 EDT Ventricular Rate:  80 PR Interval:  154 QRS Duration: 106 QT Interval:  408 QTC Calculation: 471 R Axis:   74 Text Interpretation: Sinus rhythm Confirmed by Marianna Fuss ([id number]) on 06/18/2021 7:11:04 AM  Radiology CT Renal Stone Study  Result Date: 06/17/2021 CLINICAL DATA:  Right-sided abdominal pain x3 weeks. Known hepatic lesions seen on prior outside MRI. EXAM: CT ABDOMEN AND PELVIS WITHOUT CONTRAST TECHNIQUE: Multidetector CT imaging of the abdomen and pelvis was performed following the standard protocol without IV contrast. COMPARISON:  CT July 21, 2020 FINDINGS: Lower chest: No acute abnormality. Hepatobiliary: Scattered ill-defined hypodense hepatic lesions for instance a 13 mm lesion on image 31/2 and a 14 mm lesion on image 15/2. Gallbladder is unremarkable. No biliary ductal dilation. Pancreas: Within normal limits. Spleen: Within normal limits. Adrenals/Urinary Tract: Bilateral adrenal glands are unremarkable. Punctate nonobstructive 3 mm left lower pole renal stone. No obstructive ureteral or bladder calculi visualized. Urinary bladder is unremarkable for degree of distension.  Stomach/Bowel: Stomach is unremarkable for degree of distension. No pathologic dilation of small or large bowel. The appendix and terminal ileum appear normal. No evidence of acute bowel inflammation. Vascular/Lymphatic: No abdominal aortic aneurysm. No pathologically enlarged abdominal or pelvic lymph nodes. Reproductive: Status post hysterectomy. No adnexal masses. Other: No significant abdominopelvic ascites. Musculoskeletal: No acute osseous abnormality. IMPRESSION: 1. No acute findings in the abdomen or pelvis. 2. Punctate nonobstructive left lower pole renal stone. No obstructive ureteral or bladder calculi visualized. 3. Scattered ill-defined hypodense hepatic lesions, nonspecific but likely reflect the lesion seen on outside MRI for which a biopsy is planned, per indication history. Electronically Signed   By: Maudry Mayhew M.D.   On: 06/17/2021 14:00    Procedures Procedures   Medications Ordered in ED Medications  HYDROmorphone (DILAUDID) injection 1 mg (1 mg Intravenous Given 06/17/21 1355)  ondansetron (ZOFRAN) injection 4 mg (4 mg Intravenous Given 06/17/21 1355)    ED Course  I have reviewed the triage vital signs and the nursing notes.  Pertinent labs & imaging results that were available during my care of the patient were reviewed by me and considered in my medical decision making (see chart for details).    MDM Rules/Calculators/A&P                           40 year old lady presents to ER with concern for right-sided pain ongoing for the last couple weeks.  Had outpatient MRI that showed liver lesions.  Patient states she is aware of this and potentially will be getting biopsy.  Patient on exam did have focal tenderness in the right flank.  Checked CT renal study, no evidence for obstructing ureteral or bladder stones. No other acute pathology identified.  Basic labs are stable.  Recommend she follow-up with GI and her primary doctor, DC home.  After the discussed management  above, the patient was determined to be safe for discharge.  The patient was in agreement with this plan and all questions regarding their care were answered.  ED return precautions were discussed and the patient will return to the ED with any significant worsening of condition.  Final Clinical Impression(s) / ED Diagnoses Final  diagnoses:  Generalized abdominal pain    Rx / DC Orders ED Discharge Orders          Ordered    oxyCODONE-acetaminophen (PERCOCET/ROXICET) 5-325 MG tablet  Every 6 hours PRN,   Status:  Discontinued        06/17/21 1433    oxyCODONE (OXY IR/ROXICODONE) 5 MG immediate release tablet  Every 4 hours PRN        06/17/21 1444             Milagros Loll, MD 06/18/21 0715

## 2021-10-18 ENCOUNTER — Encounter (HOSPITAL_BASED_OUTPATIENT_CLINIC_OR_DEPARTMENT_OTHER): Payer: Self-pay

## 2021-10-18 ENCOUNTER — Other Ambulatory Visit: Payer: Self-pay

## 2021-10-18 ENCOUNTER — Emergency Department (HOSPITAL_BASED_OUTPATIENT_CLINIC_OR_DEPARTMENT_OTHER)
Admission: EM | Admit: 2021-10-18 | Discharge: 2021-10-18 | Discharge status: Against Medical Advice | Payer: No Typology Code available for payment source | Attending: Emergency Medicine | Admitting: Emergency Medicine

## 2021-10-18 DIAGNOSIS — R6883 Chills (without fever): Secondary | ICD-10-CM | POA: Diagnosis not present

## 2021-10-18 DIAGNOSIS — Z5321 Procedure and treatment not carried out due to patient leaving prior to being seen by health care provider: Secondary | ICD-10-CM | POA: Insufficient documentation

## 2021-10-18 DIAGNOSIS — R109 Unspecified abdominal pain: Secondary | ICD-10-CM | POA: Diagnosis present

## 2021-10-18 DIAGNOSIS — R160 Hepatomegaly, not elsewhere classified: Secondary | ICD-10-CM | POA: Insufficient documentation

## 2021-10-18 DIAGNOSIS — R7989 Other specified abnormal findings of blood chemistry: Secondary | ICD-10-CM | POA: Insufficient documentation

## 2021-10-18 DIAGNOSIS — R112 Nausea with vomiting, unspecified: Secondary | ICD-10-CM | POA: Insufficient documentation

## 2021-10-18 NOTE — ED Notes (Signed)
Pt informed registration staff that her PCP called her and will see her today. Pt informed that she could stay to see doctor, however pt continues to request to leave. Pt noted to leave building per registration staff.

## 2021-10-18 NOTE — ED Triage Notes (Signed)
Pt c/o right side abd pain, n/v, chills-started 1/13-states she has enlarged liver, liver lesions and elevated LFTs-states her GI advised her to come to ED-NAD-to triage in w/c

## 2022-05-19 IMAGING — CT CT RENAL STONE PROTOCOL
2 of 4 series · 16 of 46 positions shown, 18 images · non-contrast
Comparison: CT July 21, 2020

CLINICAL DATA: Right-sided abdominal pain x3 weeks. Known hepatic
lesions seen on prior outside MRI.

EXAM:
CT ABDOMEN AND PELVIS WITHOUT CONTRAST
TECHNIQUE: Multidetector CT imaging of the abdomen and pelvis was performed
following the standard protocol without IV contrast.

[Series 2: axial st · axial · 0.90mm/px · z∈[-503,-58]mm · 13 of 99 slices shown, 15 images]
[im 5/99  soft-tissue]
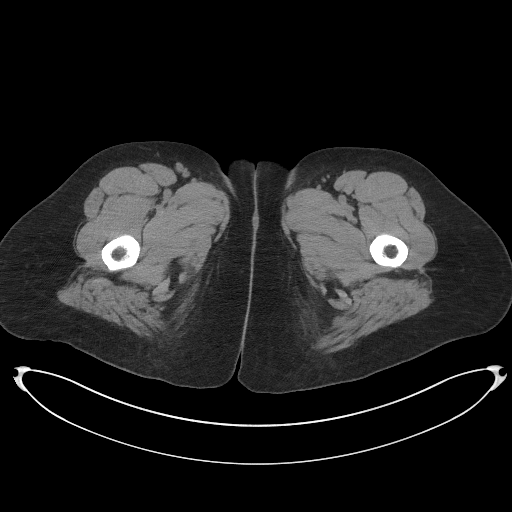
[im 5/99  bone]
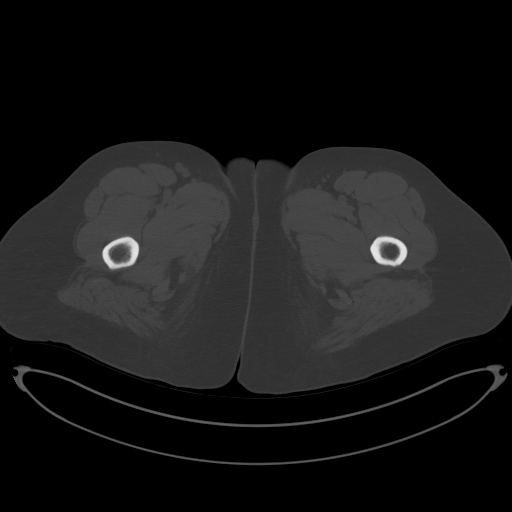
[im 13/99  soft-tissue]
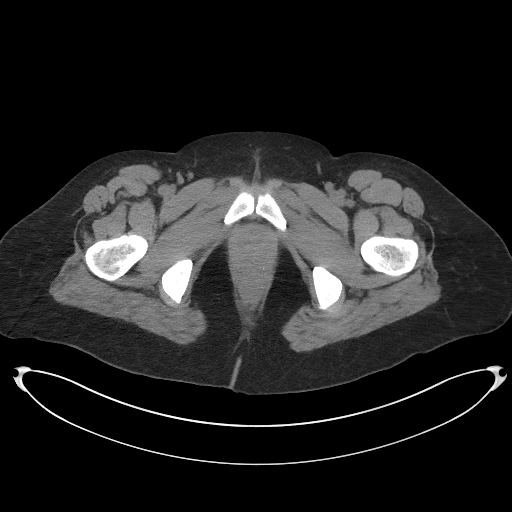
[im 21/99  soft-tissue]
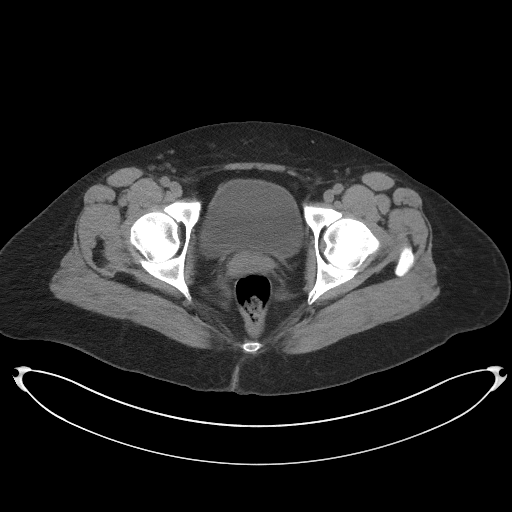
[im 29/99  soft-tissue]
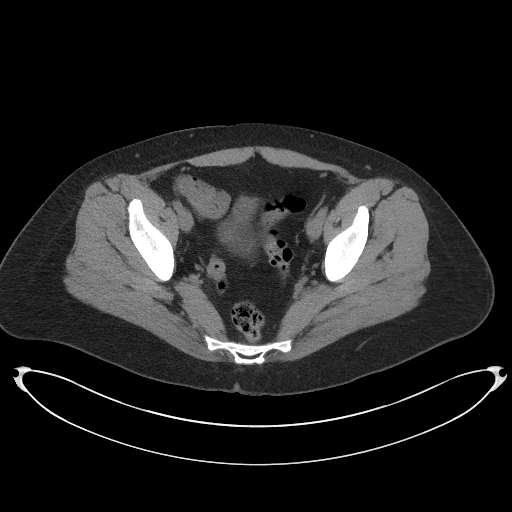
[im 33/99  soft-tissue]
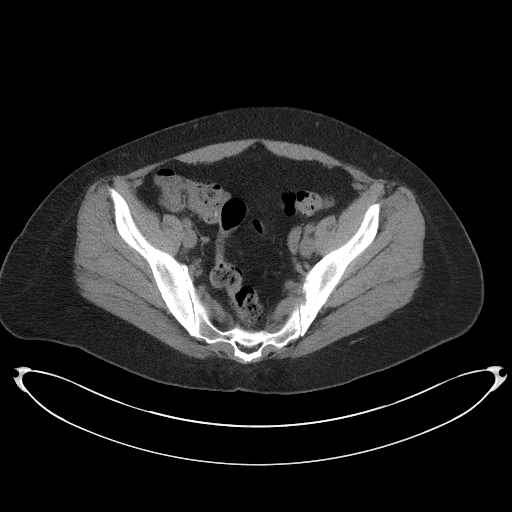
[im 41/99  soft-tissue]
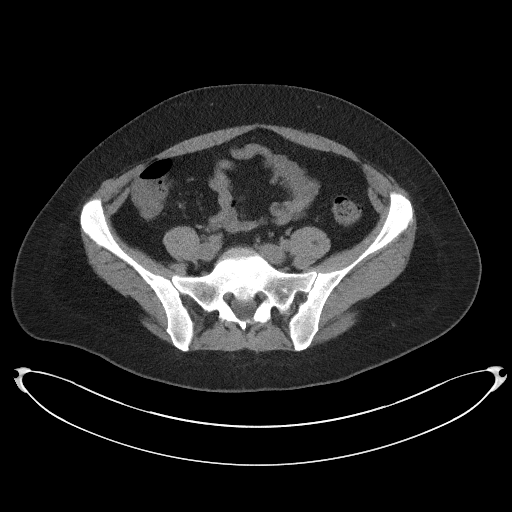
[im 50/99  soft-tissue]
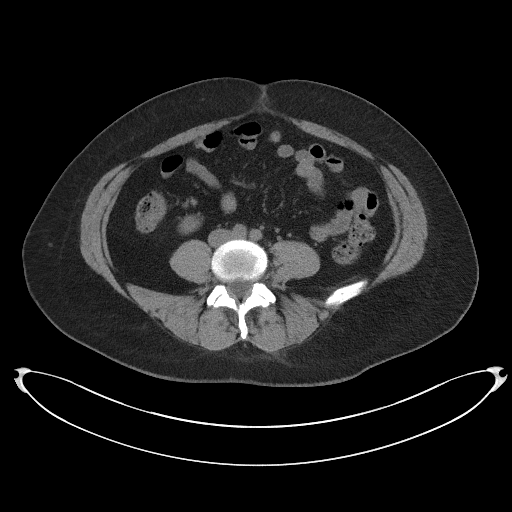
[im 58/99  soft-tissue]
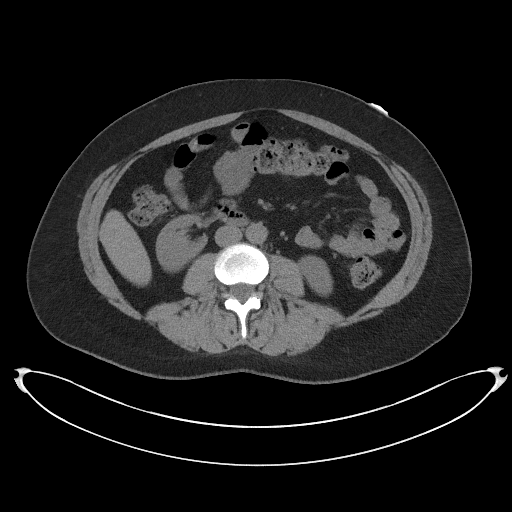
[im 66/99  soft-tissue]
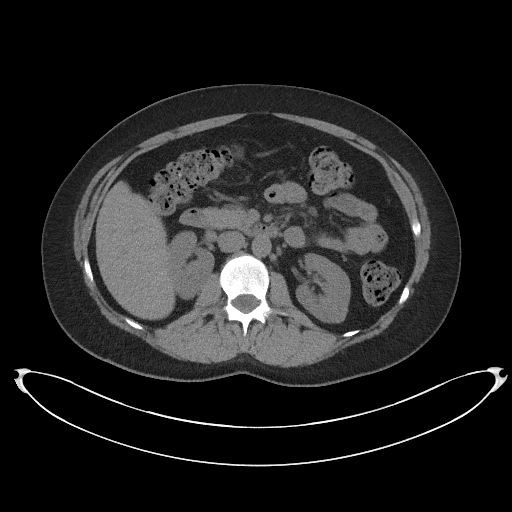
[im 66/99  bone]
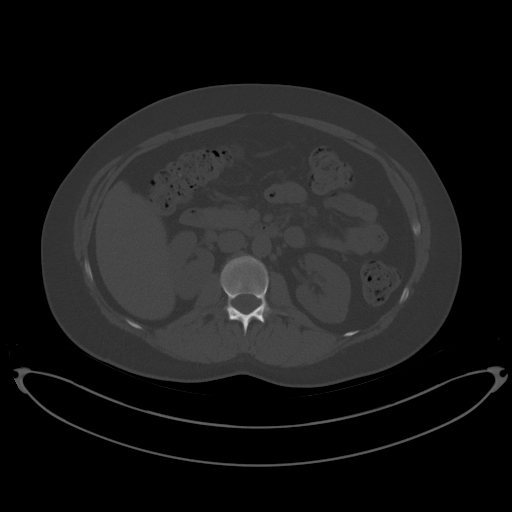
[im 70/99  soft-tissue]
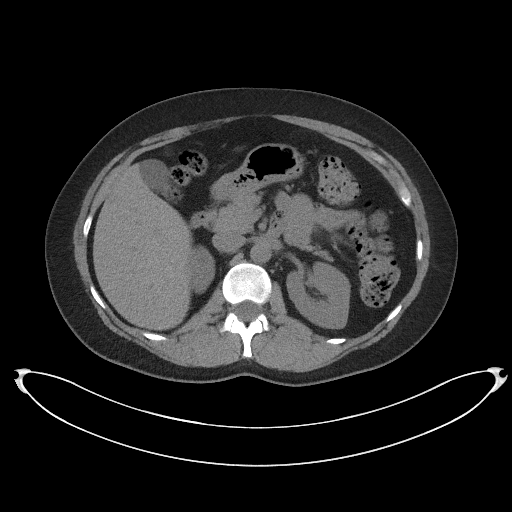
[im 78/99  soft-tissue]
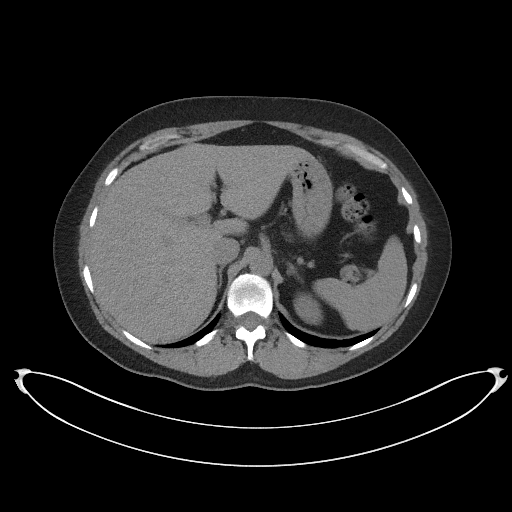
[im 86/99  soft-tissue]
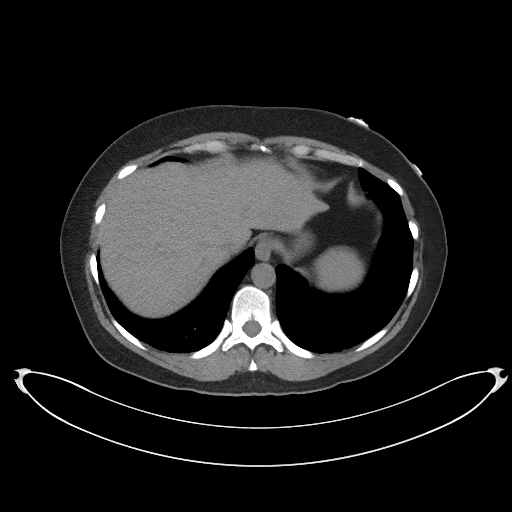
[im 94/99  soft-tissue]
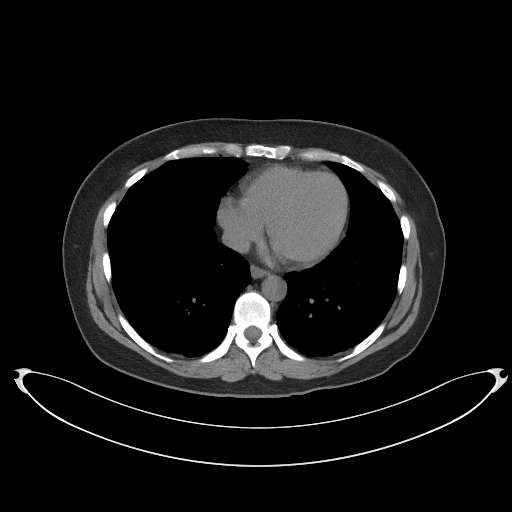

[Series 5: coronal st · coronal · 0.83mm/px · 3 of 96 slices shown]
[im 32/96  soft-tissue]
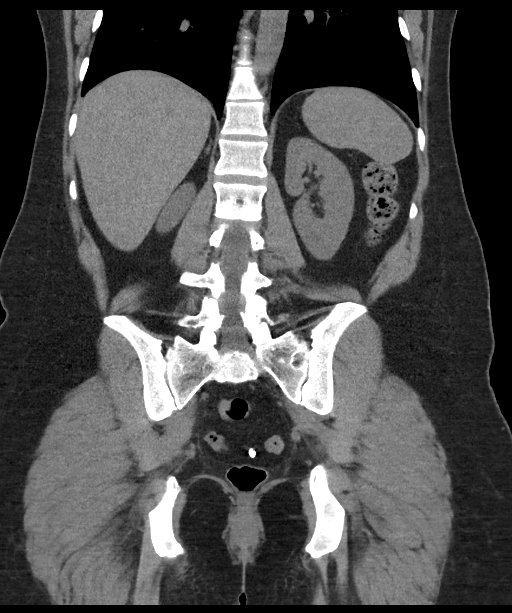
[im 43/96  soft-tissue]
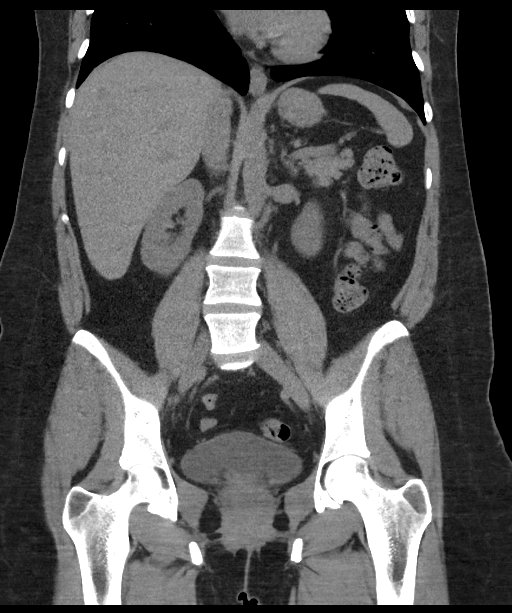
[im 53/96  soft-tissue]
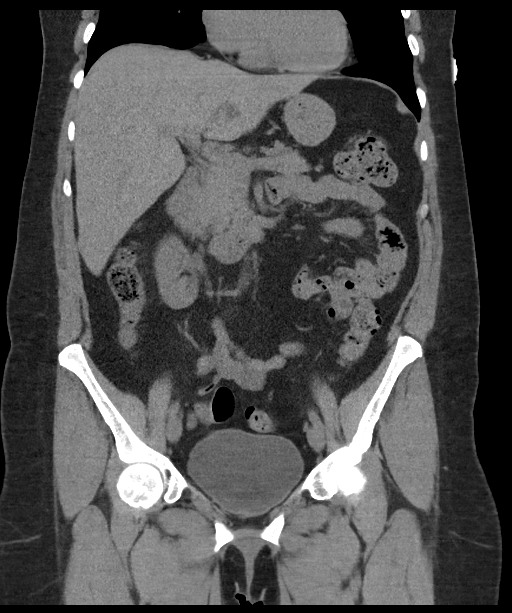

[16 of 46 positions shown; findings below may reference images not displayed]

FINDINGS: Lower chest: No acute abnormality.

Hepatobiliary: Scattered ill-defined hypodense hepatic lesions for
instance a 13 mm lesion on image [DATE] and a 14 mm lesion on image
[DATE]. Gallbladder is unremarkable. No biliary ductal dilation.

Pancreas: Within normal limits.

Spleen: Within normal limits.

Adrenals/Urinary Tract: Bilateral adrenal glands are unremarkable.
Punctate nonobstructive 3 mm left lower pole renal stone. No
obstructive ureteral or bladder calculi visualized. Urinary bladder
is unremarkable for degree of distension.

Stomach/Bowel: Stomach is unremarkable for degree of distension. No
pathologic dilation of small or large bowel. The appendix and
terminal ileum appear normal. No evidence of acute bowel
inflammation.

Vascular/Lymphatic: No abdominal aortic aneurysm. No pathologically
enlarged abdominal or pelvic lymph nodes.

Reproductive: Status post hysterectomy. No adnexal masses.

Other: No significant abdominopelvic ascites.

Musculoskeletal: No acute osseous abnormality.
IMPRESSION: 1. No acute findings in the abdomen or pelvis.
2. Punctate nonobstructive left lower pole renal stone. No
obstructive ureteral or bladder calculi visualized.
3. Scattered ill-defined hypodense hepatic lesions, nonspecific but
likely reflect the lesion seen on outside MRI for which a biopsy is
planned, per indication history.

## 2023-01-12 ENCOUNTER — Emergency Department (HOSPITAL_BASED_OUTPATIENT_CLINIC_OR_DEPARTMENT_OTHER)
Admission: EM | Admit: 2023-01-12 | Discharge: 2023-01-12 | Disposition: A | Discharge status: Home / Self Care | Payer: No Typology Code available for payment source | Attending: Emergency Medicine | Admitting: Emergency Medicine

## 2023-01-12 ENCOUNTER — Other Ambulatory Visit: Payer: Self-pay

## 2023-01-12 ENCOUNTER — Encounter (HOSPITAL_BASED_OUTPATIENT_CLINIC_OR_DEPARTMENT_OTHER): Payer: Self-pay | Admitting: Pediatrics

## 2023-01-12 ENCOUNTER — Emergency Department (HOSPITAL_BASED_OUTPATIENT_CLINIC_OR_DEPARTMENT_OTHER): Payer: No Typology Code available for payment source

## 2023-01-12 DIAGNOSIS — Z1152 Encounter for screening for COVID-19: Secondary | ICD-10-CM | POA: Diagnosis not present

## 2023-01-12 DIAGNOSIS — J069 Acute upper respiratory infection, unspecified: Secondary | ICD-10-CM | POA: Insufficient documentation

## 2023-01-12 DIAGNOSIS — R059 Cough, unspecified: Secondary | ICD-10-CM | POA: Diagnosis present

## 2023-01-12 DIAGNOSIS — Z8616 Personal history of COVID-19: Secondary | ICD-10-CM | POA: Diagnosis not present

## 2023-01-12 LAB — RESP PANEL BY RT-PCR (RSV, FLU A&B, COVID)  RVPGX2
Influenza A by PCR: NEGATIVE
Influenza B by PCR: NEGATIVE
Resp Syncytial Virus by PCR: NEGATIVE
SARS Coronavirus 2 by RT PCR: NEGATIVE

## 2023-01-12 MED ORDER — BENZONATATE 100 MG PO CAPS: 100.0000 mg | ORAL_CAPSULE | Freq: Three times a day (TID) | ORAL | 0 refills | Status: DC | PRN

## 2023-01-12 MED ORDER — CEFDINIR 300 MG PO CAPS: 300.0000 mg | ORAL_CAPSULE | Freq: Two times a day (BID) | ORAL | 0 refills | Status: DC

## 2023-01-12 NOTE — Discharge Instructions (Addendum)
Note the workup today was overall reassuring.  He tested negative for COVID, influenza, RSV.  Chest x-ray was without signs of pneumonia or infiltration.  Recommend continued treatment at home with cough suppressant as needed, nasal steroid spray in the form of Nasacort/Flonase, allergy medicine in the form of Zyrtec/Claritin/Allegra.  Given appearance of left ear, will treat empirically with antibiotics for ear infection.  Please not hesitate to return to emergency department for worrisome signs and symptoms we discussed become apparent.

## 2023-01-12 NOTE — ED Provider Notes (Signed)
[place] EMERGENCY DEPARTMENT AT [facility] Provider Note   CSN: [id number] Arrival date & time: 01/12/23  1549     History  Chief Complaint  Patient presents with   Cough    Kristin Jarvis is a 42 y.o. female.   Cough   42 year old female presents emergency department with complaints of cough, chest pain with cough, nasal congestion, ear pressure, left ear pain.  Patient reports history of symptoms beginning earlier this week.  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  Symptoms again with nasal congestion as well as sore throat of which seem to have improved.  Patient has been taking at home Mucinex dm as well as Tamiflu after given prescription over telehealth visit due to symptoms consistent with influenza.  Patient denies fever, shortness of breath, abdominal pain, nausea, vomiting, urinary symptoms, change in bowel habits.  Past medical history significant for IBS, nephrolithiasis, COVID-19  Home Medications Prior to Admission medications   Medication Sig Start Date End Date Taking? Authorizing Provider  benzonatate (TESSALON) 100 MG capsule Take 1 capsule (100 mg total) by mouth 3 (three) times daily as needed for cough. 01/12/23  Yes Sherian Maroon A, PA  cefdinir (OMNICEF) 300 MG capsule Take 1 capsule (300 mg total) by mouth 2 (two) times daily. 01/12/23  Yes Sherian Maroon A, PA  Alum & Mag Hydroxide-Simeth (GI COCKTAIL) SUSP suspension Take 30 mLs by mouth 2 (two) times daily as needed for indigestion. Shake well. 10/28/18   Garlon Hatchet, PA-C  Ascorbic Acid (VITAMIN C) 100 MG tablet Take by mouth.    [provider]  Ascorbic Acid (VITAMIN C) 500 MG CHEW Chew by mouth.    [provider]  b complex vitamins tablet Take by mouth.    [provider]  baclofen (LIORESAL) 10 MG tablet Take by mouth. 05/15/15   [provider]  ciprofloxacin (CIPRO) 500 MG tablet Take 1 tablet (500 mg  total) by mouth 2 (two) times daily. 04/10/16   Renne Crigler, PA-C  dicyclomine (BENTYL) 20 MG tablet Take 1 tablet (20 mg total) by mouth 2 (two) times daily. 10/28/18   Garlon Hatchet, PA-C  eletriptan (RELPAX) 40 MG tablet Take by mouth. 07/06/17   [provider]  famotidine (PEPCID) 20 MG tablet Take by mouth. 07/01/16   [provider]  Galcanezumab-gnlm 120 MG/ML SOAJ Inject into the skin. 04/27/18   [provider]  HYDROcodone-acetaminophen (NORCO/VICODIN) 5-325 MG tablet Take 1-2 tablets by mouth every 6 (six) hours as needed. 04/09/16   Santiago Glad, PA-C  ibuprofen (ADVIL,MOTRIN) 800 MG tablet Take 1 tablet (800 mg total) by mouth 3 (three) times daily. 07/19/15   Rancour, Jeannett Senior, MD  Magnesium 250 MG TABS Take by mouth.    [provider]  Multiple Vitamins-Minerals (MULTIVITAMIN ADULTS) TABS Take by mouth.    [provider]  norethindrone-ethinyl estradiol (MICROGESTIN,JUNEL,LOESTRIN) 1-20 MG-MCG tablet TAKE 1 TABLET BY MOUTH DAILY. PT TAKES THIS IN A CONTINOUS FASHION SKIPPING SUGAR PILLS. 05/29/18   [provider]  omeprazole (PRILOSEC) 40 MG capsule Take by mouth. 05/09/14   [provider]  ondansetron (ZOFRAN) 4 MG tablet Take 1 tablet (4 mg total) by mouth every 6 (six) hours. 04/09/16   Santiago Glad, PA-C  polyethylene glycol (MIRALAX / GLYCOLAX) packet Take by mouth. 12/14/17   [provider]  promethazine (PHENERGAN) 12.5 MG tablet Take 1 or 2 tabs as needed for nausea up to  three times a day 01/31/18   [provider]  Topiramate ER (TROKENDI XR) 200 MG CP24 TAKE ONE CAPSULE (200 MG DOSE) BY MOUTH DAILY. 10/04/17   [provider]  verapamil (VERELAN) 100 MG 24 hr capsule Take by mouth. 12/22/14 09/18/19  [provider]      Allergies    Acetaminophen, Pyridium [phenazopyridine hcl], and Sulfonamide derivatives    Review of Systems   Review of Systems  Respiratory:  Positive  for cough.   All other systems reviewed and are negative.   Physical Exam Updated Vital Signs BP 116/77 (BP Location: Right Arm)   Pulse 83   Temp 98.1 F (36.7 C) (Oral)   Resp 16   Ht 6' (1.829 m)   Wt 83.5 kg   SpO2 100%   BMI 24.95 kg/m  Physical Exam Vitals and nursing note reviewed.  Constitutional:      General: She is not in acute distress.    Appearance: She is well-developed.  HENT:     Head: Normocephalic and atraumatic.     Right Ear: Tympanic membrane, ear canal and external ear normal.     Left Ear: Ear canal and external ear normal.     Ears:     Comments: Patient with erythematous appearing left-sided TM with appreciable purulent fluid behind TM.  No abnormalities of external auditory canal.    Nose: Congestion and rhinorrhea present.     Mouth/Throat:     Mouth: Mucous membranes are moist.     Pharynx: Oropharynx is clear. No oropharyngeal exudate or posterior oropharyngeal erythema.  Eyes:     Extraocular Movements: Extraocular movements intact.     Conjunctiva/sclera: Conjunctivae normal.     Pupils: Pupils are equal, round, and reactive to light.  Cardiovascular:     Rate and Rhythm: Normal rate and regular rhythm.     Heart sounds: No murmur heard. Pulmonary:     Effort: Pulmonary effort is normal. No respiratory distress.     Breath sounds: Normal breath sounds. No wheezing, rhonchi or rales.  Abdominal:     Palpations: Abdomen is soft.     Tenderness: There is no abdominal tenderness. There is no right CVA tenderness, left CVA tenderness or guarding.  Musculoskeletal:        General: No swelling.     Cervical back: Neck supple.     Right lower leg: No edema.     Left lower leg: No edema.  Skin:    General: Skin is warm and dry.     Capillary Refill: Capillary refill takes less than 2 seconds.  Neurological:     Mental Status: She is alert.  Psychiatric:        Mood and Affect: Mood normal.     ED Results / Procedures / Treatments    Labs (all labs ordered are listed, but only abnormal results are displayed) Labs Reviewed  RESP PANEL BY RT-PCR (RSV, FLU A&B, COVID)  [id number]    EKG EKG Interpretation  Date/Time:  Thursday January 12 2023 17:34:06 EDT Ventricular Rate:  68 PR Interval:  172 QRS Duration: 114 QT Interval:  451 QTC Calculation: 480 R Axis:   76 Text Interpretation: Sinus rhythm Incomplete right bundle branch block No significant change since prior 9/22 Confirmed by Meridee Score [id number]) on 01/12/2023 5:36:23 PM  Radiology DG Chest 2 View  Result Date: 01/12/2023 CLINICAL DATA:  Cough with chest pain EXAM: CHEST - 2 VIEW COMPARISON:  07/21/2020 FINDINGS: Artifact projects  over the mid chest on the frontal radiograph, possibly from clothing. Right rotator cuff repair. Midline trachea. Normal heart size and mediastinal contours. No pleural effusion or pneumothorax. Clear lungs. IMPRESSION: Mild artifact degradation. No acute findings. Electronically Signed   By: Jeronimo GreavesKyle  Talbot M.D.   On: 01/12/2023 17:31    Procedures Procedures    Medications Ordered in ED Medications - No data to display  ED Course/ Medical Decision Making/ A&P                             Medical Decision Making Amount and/or Complexity of Data Reviewed Radiology: ordered.  Risk Prescription drug management.   This patient presents to the ED for concern of URI symptoms with left ear pain, this involves an extensive number of treatment options, and is a complaint that carries with it a high risk of complications and morbidity.  The differential diagnosis includes COVID, influenza, RSV, pneumonia, sepsis, meningitis, otitis media, otitis externa   Co morbidities that complicate the patient evaluation  See HPI   Additional history obtained:  Additional history obtained from EMR External records from outside source obtained and reviewed including hospital records   Lab Tests:  I Ordered, and personally interpreted  labs.  The pertinent results include: Patient negative for COVID, RSV, influenza   Imaging Studies ordered:  I ordered imaging studies including chest x-ray I independently visualized and interpreted imaging which showed no acute cardiopulmonary abnormalities I agree with the radiologist interpretation   Cardiac Monitoring: / EKG:  The patient was maintained on a cardiac monitor.  I personally viewed and interpreted the cardiac monitored which showed an underlying rhythm of: Sinus rhythm with evidence of right incomplete bundle branch block.  Similar events prior EKG performed.   Consultations Obtained:  N/a   Problem List / ED Course / Critical interventions / Medication management  Viral URI with cough, left otitis media Reevaluation of the patient showed that the patient stayed the same I have reviewed the patients home medicines and have made adjustments as needed   Social Determinants of Health:  Former cigarette use.  Denies illicit drug use.   Test / Admission - Considered:  Viral URI with cough, left otitis media Vitals signs within normal range and stable throughout visit. Laboratory/imaging studies significant for: See above 42 year old female presents emergency department with multiple complaints as listed above.  Patient's workup today overall reassuring.  Symptoms most consistent with viral upper respiratory illness.  No evidence of pneumonia on chest x-ray other infiltration.  Patient's chest pain seems to be related to cough without being experienced acutely otherwise.  Patient also with evidence of left-sided otitis media as indicated above.  Would recommend further symptomatic therapy at home with Tylenol/Motrin as needed for pain, antihistamine, nasal steroid spray, hospice to use as needed.  Recommend reevaluation by primary care for reassessment of symptoms.  Patient overall well-appearing, afebrile in no acute distress.  Treatment plan discussed at length  with patient and she acknowledged understanding was agreeable to said plan. Worrisome signs and symptoms were discussed with the patient, and the patient acknowledged understanding to return to the ED if noticed. Patient was stable upon discharge.          Final Clinical Impression(s) / ED Diagnoses Final diagnoses:  Viral URI with cough    Rx / DC Orders ED Discharge Orders          Ordered    benzonatate (  TESSALON) 100 MG capsule  3 times daily PRN        01/12/23 1741    cefdinir (OMNICEF) 300 MG capsule  2 times daily        01/12/23 1741              Peter Garter, [place] 01/12/23 [place]    Terrilee Files, MD 01/13/23 1053

## 2023-01-12 NOTE — ED Triage Notes (Signed)
Reports cough since yesterday; and started having chest pain 30 mins ago, states hurst to breathe.

## 2023-10-23 ENCOUNTER — Observation Stay (HOSPITAL_COMMUNITY)
Admission: EM | Admit: 2023-10-23 | Discharge: 2023-10-25 | Disposition: A | Discharge status: Home / Self Care | Payer: No Typology Code available for payment source | Attending: Internal Medicine | Admitting: Internal Medicine

## 2023-10-23 DIAGNOSIS — E876 Hypokalemia: Secondary | ICD-10-CM | POA: Insufficient documentation

## 2023-10-23 DIAGNOSIS — F419 Anxiety disorder, unspecified: Secondary | ICD-10-CM | POA: Insufficient documentation

## 2023-10-23 DIAGNOSIS — F32A Depression, unspecified: Secondary | ICD-10-CM | POA: Insufficient documentation

## 2023-10-23 DIAGNOSIS — E785 Hyperlipidemia, unspecified: Secondary | ICD-10-CM | POA: Diagnosis not present

## 2023-10-23 DIAGNOSIS — R29810 Facial weakness: Secondary | ICD-10-CM | POA: Diagnosis not present

## 2023-10-23 DIAGNOSIS — R110 Nausea: Secondary | ICD-10-CM | POA: Diagnosis not present

## 2023-10-23 DIAGNOSIS — Z8616 Personal history of COVID-19: Secondary | ICD-10-CM | POA: Insufficient documentation

## 2023-10-23 DIAGNOSIS — R531 Weakness: Secondary | ICD-10-CM | POA: Diagnosis present

## 2023-10-23 DIAGNOSIS — G43109 Migraine with aura, not intractable, without status migrainosus: Principal | ICD-10-CM | POA: Insufficient documentation

## 2023-10-23 DIAGNOSIS — G4700 Insomnia, unspecified: Secondary | ICD-10-CM | POA: Diagnosis not present

## 2023-10-23 DIAGNOSIS — Z79899 Other long term (current) drug therapy: Secondary | ICD-10-CM | POA: Diagnosis not present

## 2023-10-23 DIAGNOSIS — G43409 Hemiplegic migraine, not intractable, without status migrainosus: Secondary | ICD-10-CM | POA: Diagnosis present

## 2023-10-23 DIAGNOSIS — Z23 Encounter for immunization: Secondary | ICD-10-CM | POA: Diagnosis not present

## 2023-10-23 NOTE — ED Notes (Signed)
Patient to CT, airway cleared by MD Madilyn Hook.

## 2023-10-23 NOTE — ED Notes (Signed)
Neurologist at bridge.

## 2023-10-24 ENCOUNTER — Observation Stay (HOSPITAL_COMMUNITY): Payer: No Typology Code available for payment source

## 2023-10-24 ENCOUNTER — Emergency Department (HOSPITAL_COMMUNITY): Payer: No Typology Code available for payment source

## 2023-10-24 ENCOUNTER — Other Ambulatory Visit: Payer: Self-pay

## 2023-10-24 ENCOUNTER — Encounter (HOSPITAL_COMMUNITY): Payer: Self-pay

## 2023-10-24 DIAGNOSIS — G43411 Hemiplegic migraine, intractable, with status migrainosus: Secondary | ICD-10-CM | POA: Diagnosis not present

## 2023-10-24 DIAGNOSIS — I639 Cerebral infarction, unspecified: Secondary | ICD-10-CM | POA: Diagnosis not present

## 2023-10-24 DIAGNOSIS — R0600 Dyspnea, unspecified: Secondary | ICD-10-CM | POA: Diagnosis not present

## 2023-10-24 DIAGNOSIS — G43409 Hemiplegic migraine, not intractable, without status migrainosus: Secondary | ICD-10-CM | POA: Diagnosis present

## 2023-10-24 DIAGNOSIS — G43109 Migraine with aura, not intractable, without status migrainosus: Principal | ICD-10-CM

## 2023-10-24 LAB — I-STAT CHEM 8, ED
BUN: 16 mg/dL (ref 6–20)
Calcium, Ion: 1.21 mmol/L (ref 1.15–1.40)
Chloride: 104 mmol/L (ref 98–111)
Creatinine, Ser: 1.2 mg/dL — ABNORMAL HIGH (ref 0.44–1.00)
Glucose, Bld: 115 mg/dL — ABNORMAL HIGH (ref 70–99)
HCT: 41 % (ref 36.0–46.0)
Hemoglobin: 13.9 g/dL (ref 12.0–15.0)
Potassium: 3.1 mmol/L — ABNORMAL LOW (ref 3.5–5.1)
Sodium: 138 mmol/L (ref 135–145)
TCO2: 22 mmol/L (ref 22–32)

## 2023-10-24 LAB — URINALYSIS, ROUTINE W REFLEX MICROSCOPIC
Bilirubin Urine: NEGATIVE
Glucose, UA: NEGATIVE mg/dL
Hgb urine dipstick: NEGATIVE
Ketones, ur: NEGATIVE mg/dL
Leukocytes,Ua: NEGATIVE
Nitrite: NEGATIVE
Protein, ur: NEGATIVE mg/dL
Specific Gravity, Urine: 1.005 (ref 1.005–1.030)
pH: 6 (ref 5.0–8.0)

## 2023-10-24 LAB — ECHOCARDIOGRAM COMPLETE
Area-P 1/2: 3.37 cm2
Calc EF: 55.8 %
S' Lateral: 3.6 cm
Single Plane A2C EF: 61.8 %
Single Plane A4C EF: 48.1 %
Weight: 2848 [oz_av]

## 2023-10-24 LAB — DIFFERENTIAL
Abs Immature Granulocytes: 0.05 10*3/uL (ref 0.00–0.07)
Basophils Absolute: 0.1 10*3/uL (ref 0.0–0.1)
Basophils Relative: 1 %
Eosinophils Absolute: 0.2 10*3/uL (ref 0.0–0.5)
Eosinophils Relative: 2 %
Immature Granulocytes: 1 %
Lymphocytes Relative: 33 %
Lymphs Abs: 3.4 10*3/uL (ref 0.7–4.0)
Monocytes Absolute: 0.7 10*3/uL (ref 0.1–1.0)
Monocytes Relative: 7 %
Neutro Abs: 5.8 10*3/uL (ref 1.7–7.7)
Neutrophils Relative %: 56 %

## 2023-10-24 LAB — LIPID PANEL
Cholesterol: 223 mg/dL — ABNORMAL HIGH (ref 0–200)
HDL: 46 mg/dL (ref >40)
LDL Cholesterol: 131 mg/dL — ABNORMAL HIGH (ref 0–99)
Total CHOL/HDL Ratio: 4.8 {ratio}
Triglycerides: 231 mg/dL — ABNORMAL HIGH (ref <150)
VLDL: 46 mg/dL — ABNORMAL HIGH (ref 0–40)

## 2023-10-24 LAB — RAPID URINE DRUG SCREEN, HOSP PERFORMED
Amphetamines: NOT DETECTED
Barbiturates: NOT DETECTED
Benzodiazepines: NOT DETECTED
Cocaine: NOT DETECTED
Opiates: NOT DETECTED
Tetrahydrocannabinol: POSITIVE — AB

## 2023-10-24 LAB — HEMOGLOBIN A1C
Hgb A1c MFr Bld: 5.5 % (ref 4.8–5.6)
Mean Plasma Glucose: 111.15 mg/dL

## 2023-10-24 LAB — COMPREHENSIVE METABOLIC PANEL
ALT: 56 U/L — ABNORMAL HIGH (ref 0–44)
AST: 39 U/L (ref 15–41)
Albumin: 4.1 g/dL (ref 3.5–5.0)
Alkaline Phosphatase: 95 U/L (ref 38–126)
Anion gap: 11 (ref 5–15)
BUN: 16 mg/dL (ref 6–20)
CO2: 21 mmol/L — ABNORMAL LOW (ref 22–32)
Calcium: 9.4 mg/dL (ref 8.9–10.3)
Chloride: 105 mmol/L (ref 98–111)
Creatinine, Ser: 1.02 mg/dL — ABNORMAL HIGH (ref 0.44–1.00)
GFR, Estimated: >60 mL/min (ref >60)
Glucose, Bld: 124 mg/dL — ABNORMAL HIGH (ref 70–99)
Potassium: 3.1 mmol/L — ABNORMAL LOW (ref 3.5–5.1)
Sodium: 137 mmol/L (ref 135–145)
Total Bilirubin: 0.5 mg/dL (ref 0.0–1.2)
Total Protein: 6.7 g/dL (ref 6.5–8.1)

## 2023-10-24 LAB — CBG MONITORING, ED: Glucose-Capillary: 138 mg/dL — ABNORMAL HIGH (ref 70–99)

## 2023-10-24 LAB — CBC
HCT: 41.1 % (ref 36.0–46.0)
Hemoglobin: 13.5 g/dL (ref 12.0–15.0)
MCH: 28.4 pg (ref 26.0–34.0)
MCHC: 32.8 g/dL (ref 30.0–36.0)
MCV: 86.5 fL (ref 80.0–100.0)
Platelets: 230 10*3/uL (ref 150–400)
RBC: 4.75 MIL/uL (ref 3.87–5.11)
RDW: 13.5 % (ref 11.5–15.5)
WBC: 10.2 10*3/uL (ref 4.0–10.5)
nRBC: 0.2 % (ref 0.0–0.2)

## 2023-10-24 LAB — PROTIME-INR
INR: 1 (ref 0.8–1.2)
Prothrombin Time: 13.5 s (ref 11.4–15.2)

## 2023-10-24 LAB — ETHANOL: Alcohol, Ethyl (B): <10 mg/dL (ref <10)

## 2023-10-24 LAB — TSH: TSH: 5.197 u[IU]/mL — ABNORMAL HIGH (ref 0.350–4.500)

## 2023-10-24 LAB — APTT: aPTT: 26 s (ref 24–36)

## 2023-10-24 MED ORDER — POLYETHYLENE GLYCOL 3350 17 G PO PACK: 17.0000 g | PACK | Freq: Every day | ORAL | Status: DC | PRN

## 2023-10-24 MED ORDER — BISACODYL 5 MG PO TBEC: 5.0000 mg | DELAYED_RELEASE_TABLET | Freq: Every day | ORAL | Status: DC | PRN

## 2023-10-24 MED ORDER — TOPIRAMATE 25 MG PO TABS
100.0000 mg | ORAL_TABLET | Freq: Two times a day (BID) | ORAL | Status: DC
  Administered 2023-10-24 (×2): 100 mg via ORAL
  Filled 2023-10-24 (×2): qty 4

## 2023-10-24 MED ORDER — ONDANSETRON HCL 4 MG/2ML IJ SOLN
4.0000 mg | Freq: Four times a day (QID) | INTRAMUSCULAR | Status: DC | PRN
  Administered 2023-10-24 – 2023-10-25 (×3): 4 mg via INTRAVENOUS
  Filled 2023-10-24 (×3): qty 2

## 2023-10-24 MED ORDER — LORAZEPAM 2 MG/ML IJ SOLN
0.5000 mg | Freq: Once | INTRAMUSCULAR | Status: AC
Start: 2023-10-24 — End: 2023-10-24
  Administered 2023-10-24: 0.5 mg via INTRAVENOUS
  Filled 2023-10-24: qty 1

## 2023-10-24 MED ORDER — TOPIRAMATE ER 200 MG PO CAP24: 200.0000 mg | ORAL_CAPSULE | Freq: Every day | ORAL | Status: DC

## 2023-10-24 MED ORDER — SODIUM CHLORIDE 0.9 % IV BOLUS
1000.0000 mL | Freq: Once | INTRAVENOUS | Status: AC
  Administered 2023-10-24: 1000 mL via INTRAVENOUS

## 2023-10-24 MED ORDER — ACETAMINOPHEN 650 MG RE SUPP: 650.0000 mg | Freq: Four times a day (QID) | RECTAL | Status: DC | PRN

## 2023-10-24 MED ORDER — IOHEXOL 350 MG/ML SOLN
75.0000 mL | Freq: Once | INTRAVENOUS | Status: AC | PRN
  Administered 2023-10-24: 75 mL via INTRAVENOUS

## 2023-10-24 MED ORDER — ACETAMINOPHEN 325 MG PO TABS: 650.0000 mg | ORAL_TABLET | Freq: Four times a day (QID) | ORAL | Status: DC | PRN

## 2023-10-24 MED ORDER — ASPIRIN 81 MG PO TBEC
81.0000 mg | DELAYED_RELEASE_TABLET | Freq: Every day | ORAL | Status: DC
  Administered 2023-10-24 – 2023-10-25 (×2): 81 mg via ORAL
  Filled 2023-10-24 (×2): qty 1

## 2023-10-24 MED ORDER — ENOXAPARIN SODIUM 40 MG/0.4ML IJ SOSY
40.0000 mg | PREFILLED_SYRINGE | INTRAMUSCULAR | Status: DC
  Administered 2023-10-24 – 2023-10-25 (×2): 40 mg via SUBCUTANEOUS
  Filled 2023-10-24 (×2): qty 0.4

## 2023-10-24 MED ORDER — KETOROLAC TROMETHAMINE 15 MG/ML IJ SOLN
15.0000 mg | Freq: Once | INTRAMUSCULAR | Status: AC
  Administered 2023-10-24: 15 mg via INTRAVENOUS
  Filled 2023-10-24: qty 1

## 2023-10-24 MED ORDER — PROCHLORPERAZINE EDISYLATE 10 MG/2ML IJ SOLN: 10.0000 mg | Freq: Four times a day (QID) | INTRAMUSCULAR | Status: DC | PRN

## 2023-10-24 MED ORDER — PROCHLORPERAZINE EDISYLATE 10 MG/2ML IJ SOLN
INTRAMUSCULAR | Status: AC
  Administered 2023-10-24: 10 mg via INTRAVENOUS
  Filled 2023-10-24: qty 2

## 2023-10-24 MED ORDER — METOCLOPRAMIDE HCL 5 MG/ML IJ SOLN
10.0000 mg | Freq: Once | INTRAMUSCULAR | Status: AC
Start: 2023-10-24 — End: 2023-10-24
  Administered 2023-10-24: 10 mg via INTRAVENOUS
  Filled 2023-10-24: qty 2

## 2023-10-24 MED ORDER — MAGNESIUM SULFATE 2 GM/50ML IV SOLN
2.0000 g | Freq: Once | INTRAVENOUS | Status: AC
  Administered 2023-10-24: 2 g via INTRAVENOUS
  Filled 2023-10-24: qty 50

## 2023-10-24 MED ORDER — PNEUMOCOCCAL 20-VAL CONJ VACC 0.5 ML IM SUSY
0.5000 mL | PREFILLED_SYRINGE | INTRAMUSCULAR | Status: AC
  Administered 2023-10-25: 0.5 mL via INTRAMUSCULAR
  Filled 2023-10-24: qty 0.5

## 2023-10-24 MED ORDER — GADOBUTROL 1 MMOL/ML IV SOLN
8.0000 mL | Freq: Once | INTRAVENOUS | Status: AC | PRN
  Administered 2023-10-24: 8 mL via INTRAVENOUS

## 2023-10-24 MED ORDER — DIPHENHYDRAMINE HCL 50 MG/ML IJ SOLN
12.5000 mg | Freq: Once | INTRAMUSCULAR | Status: AC
Start: 2023-10-24 — End: 2023-10-24
  Administered 2023-10-24: 12.5 mg via INTRAVENOUS
  Filled 2023-10-24: qty 1

## 2023-10-24 MED ORDER — POTASSIUM CHLORIDE CRYS ER 20 MEQ PO TBCR
40.0000 meq | EXTENDED_RELEASE_TABLET | Freq: Once | ORAL | Status: AC
  Administered 2023-10-24: 40 meq via ORAL
  Filled 2023-10-24: qty 2

## 2023-10-24 MED ORDER — ALBUTEROL SULFATE (2.5 MG/3ML) 0.083% IN NEBU: 2.5000 mg | INHALATION_SOLUTION | Freq: Four times a day (QID) | RESPIRATORY_TRACT | Status: DC | PRN

## 2023-10-24 MED ORDER — SODIUM CHLORIDE 0.9 % IV SOLN: INTRAVENOUS | Status: AC

## 2023-10-24 MED ORDER — HYDRALAZINE HCL 20 MG/ML IJ SOLN: 10.0000 mg | Freq: Four times a day (QID) | INTRAMUSCULAR | Status: DC | PRN

## 2023-10-24 MED ORDER — PROCHLORPERAZINE EDISYLATE 10 MG/2ML IJ SOLN
10.0000 mg | Freq: Once | INTRAMUSCULAR | Status: AC
Start: 2023-10-24 — End: 2023-10-24
  Administered 2023-10-24: 10 mg via INTRAVENOUS
  Filled 2023-10-24: qty 2

## 2023-10-24 MED ORDER — KETOROLAC TROMETHAMINE 15 MG/ML IJ SOLN
15.0000 mg | Freq: Four times a day (QID) | INTRAMUSCULAR | Status: DC
  Administered 2023-10-25 (×2): 15 mg via INTRAVENOUS
  Filled 2023-10-24 (×2): qty 1

## 2023-10-24 MED ORDER — OXYCODONE HCL 5 MG PO TABS
5.0000 mg | ORAL_TABLET | ORAL | Status: DC | PRN
  Administered 2023-10-24 – 2023-10-25 (×3): 5 mg via ORAL
  Filled 2023-10-24 (×3): qty 1

## 2023-10-24 MED ORDER — UBROGEPANT 100 MG PO TABS: 100.0000 mg | ORAL_TABLET | ORAL | Status: DC | PRN

## 2023-10-24 NOTE — ED Provider Notes (Signed)
[place] EMERGENCY DEPARTMENT AT [facility] Provider Note   CSN: [id number] Arrival date & time: 10/23/23  2356  An emergency department physician performed an initial assessment on this suspected stroke patient at 2358.  History  Chief Complaint  Patient presents with   Code Stroke    Kristin Jarvis is a 43 y.o. female.  The history is provided by the patient, the EMS personnel and medical records.  Kristin Jarvis is a 43 y.o. female who presents to the Emergency Department complaining of code stroke.  She presents to the emergency department as a code stroke for left-sided weakness.  She was in her routine state of health when she got home at 1015 this evening.  Shortly thereafter she developed a frontal headache with associated left arm and leg weakness.  She did have some speech difficulties per her husband.  No prior similar symptoms.  She does have a history of migraine headaches but this is not typical for her migraines.     Home Medications Prior to Admission medications   Medication Sig Start Date End Date Taking? Authorizing Provider  amitriptyline (ELAVIL) 25 MG tablet Take 25 mg by mouth at bedtime. 12/03/19  Yes [provider]  Ascorbic Acid (VITAMIN C) 500 MG CHEW Chew 500 mg by mouth at bedtime.   Yes [provider]  Calcium Magnesium Zinc 333-133-5 MG TABS Take 1 tablet by mouth daily. 11/24/21  Yes [provider]  citalopram (CELEXA) 20 MG tablet Take 30 mg by mouth daily. 12/25/20  Yes [provider]  fluticasone (FLONASE) 50 MCG/ACT nasal spray Place 1 spray into both nostrils daily. 09/04/23  Yes [provider]  Multiple Vitamins-Minerals (MULTIVITAMIN ADULTS) TABS Take by mouth.   Yes [provider]  omeprazole (PRILOSEC) 40 MG capsule Take 40 mg by mouth 2 (two) times daily. 05/09/14  Yes [provider]  ondansetron (ZOFRAN-ODT) 4 MG disintegrating tablet Take 4 mg by mouth every 8  (eight) hours as needed. 09/04/23  Yes [provider]  polyethylene glycol (MIRALAX / GLYCOLAX) packet Take 17 g by mouth daily as needed for mild constipation. 12/14/17  Yes [provider]  promethazine (PHENERGAN) 25 MG tablet Take 25 mg by mouth every 8 (eight) hours as needed.   Yes [provider]  Topiramate ER (TROKENDI XR) 200 MG CP24 TAKE ONE CAPSULE (200 MG DOSE) BY MOUTH DAILY. 10/04/17  Yes [provider]  UBRELVY 100 MG TABS Take 100 mg by mouth every 2 (two) hours as needed (for migraines).   Yes [provider]  famotidine (PEPCID) 20 MG tablet Take 20 mg by mouth at bedtime. 07/01/16   [provider]  Magnesium 250 MG TABS Take by mouth.    [provider]  norethindrone-ethinyl estradiol (MICROGESTIN,JUNEL,LOESTRIN) 1-20 MG-MCG tablet TAKE 1 TABLET BY MOUTH DAILY. PT TAKES THIS IN A CONTINOUS FASHION SKIPPING SUGAR PILLS. 05/29/18   [provider]      Allergies    Acetaminophen, Pyridium [phenazopyridine hcl], and Sulfonamide derivatives    Review of Systems   Review of Systems  All other systems reviewed and are negative.   Physical Exam Updated Vital Signs BP 104/74   Pulse 69   Temp 97.6 F (36.4 C) (Oral)   Resp 15   Wt 80.7 kg   SpO2 100%   BMI 24.14 kg/m  Physical Exam Vitals and nursing note reviewed.  Constitutional:      Appearance: She is well-developed.  HENT:  Head: Normocephalic and atraumatic.  Cardiovascular:     Rate and Rhythm: Normal rate and regular rhythm.  Pulmonary:     Effort: Pulmonary effort is normal. No respiratory distress.  Abdominal:     Palpations: Abdomen is soft.     Tenderness: There is no abdominal tenderness. There is no guarding or rebound.  Musculoskeletal:        General: No tenderness.  Skin:    General: Skin is warm and dry.  Neurological:     Mental Status: She is alert and oriented to person, place, and time.     Comments: Visual fields  grossly intact.  Speech is fluent.  No asymmetry of facial movements.  There is altered sensation to light touch in the left face.  There is 4 out of 5 strength in the left upper extremity, left lower extremity.  Psychiatric:        Behavior: Behavior normal.     ED Results / Procedures / Treatments   Labs (all labs ordered are listed, but only abnormal results are displayed) Labs Reviewed  COMPREHENSIVE METABOLIC PANEL - Abnormal; Notable for the following components:      Result Value   Potassium 3.1 (*)    CO2 21 (*)    Glucose, Bld 124 (*)    Creatinine, Ser 1.02 (*)    ALT 56 (*)    All other components within normal limits  RAPID URINE DRUG SCREEN, HOSP PERFORMED - Abnormal; Notable for the following components:   Tetrahydrocannabinol POSITIVE (*)    All other components within normal limits  URINALYSIS, ROUTINE W REFLEX MICROSCOPIC - Abnormal; Notable for the following components:   Color, Urine STRAW (*)    All other components within normal limits  I-STAT CHEM 8, ED - Abnormal; Notable for the following components:   Potassium 3.1 (*)    Creatinine, Ser 1.20 (*)    Glucose, Bld 115 (*)    All other components within normal limits  CBG MONITORING, ED - Abnormal; Notable for the following components:   Glucose-Capillary 138 (*)    All other components within normal limits  ETHANOL  PROTIME-INR  APTT  CBC  DIFFERENTIAL    EKG EKG Interpretation Date/Time:  Tuesday October 24 2023 00:34:41 EST Ventricular Rate:  78 PR Interval:  166 QRS Duration:  114 QT Interval:  433 QTC Calculation: 494 R Axis:   81  Text Interpretation: Sinus rhythm Borderline intraventricular conduction delay Borderline prolonged QT interval Confirmed by Tilden Fossa [id number] on 10/24/2023 12:55:03 AM  Radiology MR BRAIN W CONTRAST Result Date: 10/24/2023 CLINICAL DATA:  Evaluate for abnormal enhancement. EXAM: MRI HEAD WITH CONTRAST TECHNIQUE: Multiplanar, multiecho pulse sequences of  the brain and surrounding structures were obtained with intravenous contrast. CONTRAST:  8mL GADAVIST GADOBUTROL 1 MMOL/ML IV SOLN COMPARISON:  Prior brain MRI from earlier the same day. FINDINGS: Brain: Postcontrast imaging of the brain demonstrates no abnormal enhancement. Specifically, previously identified white matter lesion involving the right temporal lobe does not enhance. No other new findings. Vascular: Normal intravascular enhancement seen throughout the brain. Skull and upper cervical spine: No new findings. Sinuses/Orbits: No new findings. Other: None. IMPRESSION: No abnormal intracranial enhancement. Specifically, the previously identified white matter lesion involving the right temporal lobe does not enhance. Electronically Signed   By: Rise Mu M.D.   On: 10/24/2023 03:58   MR MRV HEAD W WO CONTRAST Result Date: 10/24/2023 CLINICAL DATA:  Initial evaluation for neuro deficit, headache, stroke. EXAM: MR VENOGRAM  HEAD WITHOUT AND WITH CONTRAST TECHNIQUE: Angiographic images of the intracranial venous structures were acquired using MRV technique without and with intravenous contrast. CONTRAST:  8mL GADAVIST GADOBUTROL 1 MMOL/ML IV SOLN COMPARISON:  Concomitant brain MRI. FINDINGS: Normal flow related signal and enhancement seen throughout the superior sagittal sinus to the torcula. Transverse and sigmoid sinuses are patent as are the jugular bulbs and visualized proximal internal jugular veins. Straight sinus, vein of Galen, internal cerebral veins, and basal veins of Rosenthal are patent. No evidence for dural venous sinus thrombosis. No appreciable cortical vein abnormality. No other visible pathologic enhancement within the brain. IMPRESSION: Normal intracranial MRV. No evidence for dural venous sinus thrombosis. Electronically Signed   By: Rise Mu M.D.   On: 10/24/2023 03:02   MR BRAIN WO CONTRAST Result Date: 10/24/2023 CLINICAL DATA:  Initial evaluation for headache,  neuro deficit, stroke. EXAM: MRI HEAD WITHOUT CONTRAST TECHNIQUE: Multiplanar, multiecho pulse sequences of the brain and surrounding structures were obtained without intravenous contrast. COMPARISON:  Prior CT from earlier the same day. FINDINGS: Brain: Cerebral volume within normal limits. Single 12 mm focus of FLAIR hyperintensity noted involving the periventricular in right temporal lobe (series 11, image 22), nonspecific. No other focal parenchymal signal abnormality or significant cerebral white matter disease. No evidence for acute or subacute ischemia. Gray-white matter differentiation maintained. No areas of chronic cortical infarction. No acute or chronic intracranial blood products. No mass lesion, midline shift or mass effect. No hydrocephalus or extra-axial fluid collection. Pituitary gland and suprasellar region within normal limits. Vascular: Major intracranial vascular flow voids are maintained. Skull and upper cervical spine: Craniocervical junction within normal limits. Bone marrow signal intensity normal. No scalp soft tissue abnormality. Sinuses/Orbits: Globes orbital soft tissues within normal limits. Paranasal sinuses are clear. Trace right mastoid effusion noted, of doubtful significance. Other: None. IMPRESSION: 1. No acute intracranial abnormality. 2. Single 12 mm focus of FLAIR hyperintensity involving the periventricular right temporal lobe. Finding is nonspecific, with primary differential considerations including a remote white matter insult or focus of demyelination. 3. Otherwise normal brain MRI. Electronically Signed   By: Rise Mu M.D.   On: 10/24/2023 03:01   CT HEAD CODE STROKE WO CONTRAST Result Date: 10/24/2023 CLINICAL DATA:  Code stroke. Initial evaluation for acute neuro deficit, stroke suspected. EXAM: CT HEAD WITHOUT CONTRAST TECHNIQUE: Contiguous axial images were obtained from the base of the skull through the vertex without intravenous contrast. RADIATION  DOSE REDUCTION: This exam was performed according to the departmental dose-optimization program which includes automated exposure control, adjustment of the mA and/or kV according to patient size and/or use of iterative reconstruction technique. COMPARISON:  Prior study from 10/23/2015. FINDINGS: Brain: Cerebral volume within normal limits for patient age. No acute intracranial hemorrhage. No acute large vessel territory infarct. No mass lesion, midline shift, or mass effect. Ventricles are normal in size without hydrocephalus. No extra-axial fluid collection. Vascular: No abnormal hyperdense vessel. Skull: Scalp soft tissues demonstrate no acute abnormality. Calvarium intact. Sinuses/Orbits: Globes and orbital soft tissues within normal limits. Visualized paranasal sinuses are largely clear. No significant mastoid effusion. ASPECTS [facility] Stroke Program Early CT Score) - Ganglionic level infarction (caudate, lentiform nuclei, internal capsule, insula, M1-M3 cortex): 7 - Supraganglionic infarction (M4-M6 cortex): 3 Total score (0-10 with 10 being normal): 10 IMPRESSION: 1. Negative head CT.  No acute intracranial abnormality. 2. ASPECTS is 10. These results were communicated to Dr. Otelia Limes at 12:15 am on 10/24/2023 by text page via the [facility] messaging system.  Electronically Signed   By: Rise Mu M.D.   On: 10/24/2023 00:17    Procedures Procedures    Medications Ordered in ED Medications  0.9 %  sodium chloride infusion (has no administration in time range)  metoCLOPramide (REGLAN) injection 10 mg (10 mg Intravenous Given 10/24/23 0035)  diphenhydrAMINE (BENADRYL) injection 12.5 mg (12.5 mg Intravenous Given 10/24/23 0035)  LORazepam (ATIVAN) injection 0.5 mg (0.5 mg Intravenous Given 10/24/23 0109)  gadobutrol (GADAVIST) 1 MMOL/ML injection 8 mL (8 mLs Intravenous Contrast Given 10/24/23 0152)  prochlorperazine (COMPAZINE) injection 10 mg (10 mg Intravenous Given 10/24/23 0258)  ketorolac  (TORADOL) 15 MG/ML injection 15 mg (15 mg Intravenous Given 10/24/23 0453)  sodium chloride 0.9 % bolus 1,000 mL (0 mLs Intravenous Stopped 10/24/23 0707)  magnesium sulfate IVPB 2 g 50 mL (0 g Intravenous Stopped 10/24/23 0730)  potassium chloride SA (KLOR-CON M) CR tablet 40 mEq (40 mEq Oral Given 10/24/23 1610)    ED Course/ Medical Decision Making/ A&P                                 Medical Decision Making Amount and/or Complexity of Data Reviewed Labs: ordered. Radiology: ordered.  Risk Prescription drug management. Decision regarding hospitalization.   Patient with history of migraines here for evaluation of acute headache, left-sided weakness.  She presented as a code stroke and was evaluated by neurology at time of ED presentation.  CT head is negative for acute abnormality.  MRI brain with possible demyelinating lesion.  Repeat MRI with contrast is not consistent with active demyelination.  She was treated with multiple medications for her headache with persistent headache as well as persistent left-sided deficits.  Given ongoing symptoms with plan to admit for ongoing care.  Patient updated of findings of studies and she is in agreement with treatment plan.  Hospitalist consulted for admission.        Final Clinical Impression(s) / ED Diagnoses Final diagnoses:  Complicated migraine  Hypokalemia    Rx / DC Orders ED Discharge Orders     None         Tilden Fossa, MD 10/24/23 [phone]

## 2023-10-24 NOTE — H&P (Signed)
[facility] History and Physical  Kristin Jarvis XLK:[id number] DOB: [birth year] DOA: 10/23/2023 PCP: Burnis Medin, PA-C  Presented from: Home Chief Complaint: Headache, left-sided weakness  History of Present Illness: Kristin Jarvis is a 43 y.o. female with PMH significant for migraine, IBS, insomnia, nephrolithiasis. Patient was brought to the ED last night from home as a code stroke for acute onset right frontal headache and left-sided weakness. Patient came home from her friend's house and shortly after had sudden onset right-sided headache, left upper and lower extremity weakness, left sided paresthesia, witnessed by her husband.  Also stated with nausea, no vomiting.  She has history of migraine and is on Topamax daily.  Never had complicated migraine with strokelike symptoms. Patient takes CBD Gummies for insomnia.  Occasionally smokes cigarette but does not smoke marijuana.  Denies using any out of ordinary medicines/drugs at her friend's house last night prior to the event.  In the ED, seen by neurologist. Hemodynamically stable. Initial labs with potassium 3.1. Urine drug screen positive for THC.  Patient takes cannabis Gummies nightly for insomnia. CT head unremarkable for acute intracranial abnormality MRI brain unremarkable for acute abnormality.  But also showed a nonspecific single 12 mm focus of clear hyperdensity in the periventricular right temporal lobe. MRV of brain normal without evidence of dural venous sinus thrombosis Per neurology's assessment, patient had complicated migraine versus psychogenic pseudo stroke. Patient continued to have headache, weakness, numbness. Hospitalist service was consulted for in-hospital care.  I received this patient as a carryover admission this morning. Overnight, patient received IV Ativan, IV Benadryl, IV Toradol, IV Compazine, potassium replacement At the time of my evaluation, patient was somnolent, opens eyes briefly  on command and fell back to sleep.  Able to answer questions appropriately when awakened. Husband at bedside who detailed most of the history as above.   Review of Systems:  All systems were reviewed and were negative unless otherwise mentioned in the HPI   Past medical history: Past Medical History:  Diagnosis Date   COVID-19    IBS (irritable bowel syndrome)    Renal stone     Past surgical history: Past Surgical History:  Procedure Laterality Date   ABDOMINAL HYSTERECTOMY     COLONOSCOPY     INCONTINENCE SURGERY     INCONTINENCE SURGERY     SHOULDER SURGERY     TONSILLECTOMY      Social History:  reports that she has quit smoking. Her smoking use included cigarettes. She has never used smokeless tobacco. She reports that she does not currently use alcohol. She reports that she does not currently use drugs.  Allergies:  Allergies  Allergen Reactions   Acetaminophen     Causes migraines   Pyridium [Phenazopyridine Hcl]     Unknown reaction   Sulfonamide Derivatives     Unknown reaction    Acetaminophen, Pyridium [phenazopyridine hcl], and Sulfonamide derivatives   Family history:  History reviewed. No pertinent family history.   Physical Exam: Vitals:   10/24/23 0330 10/24/23 0500 10/24/23 0800 10/24/23 0900  BP: (!) 90/56 104/74 98/66 105/66  Pulse: 96 69 73 65  Resp: 13 15 11 12   Temp:   97.9 F (36.6 C)   TempSrc:   Oral   SpO2: 94% 100% 97% 98%  Weight:       Wt Readings from Last 3 Encounters:  10/24/23 80.7 kg  01/12/23 83.5 kg  10/18/21 92.5 kg   Body mass index is 24.14 kg/m.  General exam:  Pleasant, not in distress Skin: No rashes, lesions or ulcers. HEENT: Atraumatic, normocephalic, no obvious bleeding Lungs: Clear to auscultation bilaterally,  CVS: S1, S2, no murmur,   GI/Abd: Soft, nontender, nondistended, bowel sound present,   CNS: Somnolent, opens eyes on command briefly, able to answer orientation questions, falls back to sleep  right away Psychiatry: Mood appropriate,  Extremities: No pedal edema, no calf tenderness,    ----------------------------------------------------------------------------------------------------------------------------------------- ----------------------------------------------------------------------------------------------------------------------------------------- -----------------------------------------------------------------------------------------------------------------------------------------  Assessment/Plan: Principal Problem:   Migraine, hemiplegic  Acute onset neurological symptoms H/o migraine Presented with acute right-sided frontal headache, left-sided weakness and paresthesia Imagings negative for stroke Neurology evaluation obtained Suspected complicated migraine versus psychogenic pseudostroke Neurology day team to follow-up As needed IV medicines given in the ED overnight PTA meds- Topamax 200 mg daily, Ubrelvy 100 mg PRN Resume both  ??  Anxiety/depression PTA meds- amitriptyline 25 mg nightly, Celexa 30 mg daily Resume once mental status improves  Intractable nausea No vomiting. As needed Zofran Encourage oral intake.  Regular diet ordered.  Hypokalemia Potassium low at 3.1 last night.  Oral replacement was given.  Continue to monitor Recent Labs  Lab 10/24/23 0003  K 3.1*  3.1*   Insomnia Takes CBD Gummies nightly  Mobility: Encourage ambulation  Goals of care:   Code Status: Full Code    DVT prophylaxis:  enoxaparin (LOVENOX) injection 40 mg Start: 10/24/23 0815   Antimicrobials: None Fluid: None Consultants: Neurology Family Communication: Husband at bedside  Dispo: The patient is from: Home              Anticipated d/c is to: Hopefully home in 1 to 2 days  Diet: Diet Order             Diet regular Fluid consistency: Thin  Diet effective now                     ------------------------------------------------------------------------------------- Severity of Illness: The appropriate patient status for this patient is OBSERVATION. Observation status is judged to be reasonable and necessary in order to provide the required intensity of service to ensure the patient's safety. The patient's presenting symptoms, physical exam findings, and initial radiographic and laboratory data in the context of their medical condition is felt to place them at decreased risk for further clinical deterioration. Furthermore, it is anticipated that the patient will be medically stable for discharge from the hospital within 2 midnights of admission.  -------------------------------------------------------------------------------------   Home Meds: Prior to Admission medications   Medication Sig Start Date End Date Taking? Authorizing Provider  amitriptyline (ELAVIL) 25 MG tablet Take 25 mg by mouth at bedtime. 12/03/19  Yes [provider]  Ascorbic Acid (VITAMIN C) 500 MG CHEW Chew 500 mg by mouth at bedtime.   Yes [provider]  Calcium Magnesium Zinc 333-133-5 MG TABS Take 1 tablet by mouth daily. 11/24/21  Yes [provider]  citalopram (CELEXA) 20 MG tablet Take 30 mg by mouth daily. 12/25/20  Yes [provider]  fluticasone (FLONASE) 50 MCG/ACT nasal spray Place 1 spray into both nostrils daily. 09/04/23  Yes [provider]  Multiple Vitamins-Minerals (MULTIVITAMIN ADULTS) TABS Take by mouth.   Yes [provider]  omeprazole (PRILOSEC) 40 MG capsule Take 40 mg by mouth 2 (two) times daily. 05/09/14  Yes [provider]  ondansetron (ZOFRAN-ODT) 4 MG disintegrating tablet Take 4 mg by mouth every 8 (eight) hours as needed. 09/04/23  Yes [provider]  polyethylene glycol (MIRALAX / GLYCOLAX) packet Take 17  g by mouth daily as needed for mild constipation. 12/14/17  Yes [provider]   promethazine (PHENERGAN) 25 MG tablet Take 25 mg by mouth every 8 (eight) hours as needed.   Yes [provider]  Topiramate ER (TROKENDI XR) 200 MG CP24 TAKE ONE CAPSULE (200 MG DOSE) BY MOUTH DAILY. 10/04/17  Yes [provider]  UBRELVY 100 MG TABS Take 100 mg by mouth every 2 (two) hours as needed (for migraines).   Yes [provider]  famotidine (PEPCID) 20 MG tablet Take 20 mg by mouth at bedtime. 07/01/16   [provider]  Magnesium 250 MG TABS Take by mouth.    [provider]  norethindrone-ethinyl estradiol (MICROGESTIN,JUNEL,LOESTRIN) 1-20 MG-MCG tablet TAKE 1 TABLET BY MOUTH DAILY. PT TAKES THIS IN A CONTINOUS FASHION SKIPPING SUGAR PILLS. 05/29/18   [provider]    Labs on Admission:   CBC: Recent Labs  Lab 10/24/23 0003  WBC 10.2  NEUTROABS 5.8  HGB 13.5  13.9  HCT 41.1  41.0  MCV 86.5  PLT 230    Basic Metabolic Panel: Recent Labs  Lab 10/24/23 0003  NA 137  138  K 3.1*  3.1*  CL 105  104  CO2 21*  GLUCOSE 124*  115*  BUN 16  16  CREATININE 1.02*  1.20*  CALCIUM 9.4    Liver Function Tests: Recent Labs  Lab 10/24/23 0003  AST 39  ALT 56*  ALKPHOS 95  BILITOT 0.5  PROT 6.7  ALBUMIN 4.1   No results for input(s): "LIPASE", "AMYLASE" in the last 168 hours. No results for input(s): "AMMONIA" in the last 168 hours.  Cardiac Enzymes: No results for input(s): "CKTOTAL", "CKMB", "CKMBINDEX", "TROPONINI" in the last 168 hours.  BNP (last 3 results) No results for input(s): "BNP" in the last 8760 hours.  ProBNP (last 3 results) No results for input(s): "PROBNP" in the last 8760 hours.  CBG: Recent Labs  Lab 10/23/23 2358  GLUCAP 138*    Lipase     Component Value Date/Time   LIPASE 47 06/17/2021 1310     Urinalysis    Component Value Date/Time   COLORURINE STRAW (A) 10/24/2023 0216   APPEARANCEUR CLEAR 10/24/2023 0216   LABSPEC 1.005 10/24/2023 0216   PHURINE 6.0  10/24/2023 0216   GLUCOSEU NEGATIVE 10/24/2023 0216   HGBUR NEGATIVE 10/24/2023 0216   BILIRUBINUR NEGATIVE 10/24/2023 0216   KETONESUR NEGATIVE 10/24/2023 0216   PROTEINUR NEGATIVE 10/24/2023 0216   UROBILINOGEN 0.2 07/19/2015 1559   NITRITE NEGATIVE 10/24/2023 0216   LEUKOCYTESUR NEGATIVE 10/24/2023 0216     Drugs of Abuse     Component Value Date/Time   LABOPIA NONE DETECTED 10/24/2023 0216   COCAINSCRNUR NONE DETECTED 10/24/2023 0216   LABBENZ NONE DETECTED 10/24/2023 0216   AMPHETMU NONE DETECTED 10/24/2023 0216   THCU POSITIVE (A) 10/24/2023 0216   LABBARB NONE DETECTED 10/24/2023 0216      Radiological Exams on Admission: MR BRAIN W CONTRAST Result Date: 10/24/2023 CLINICAL DATA:  Evaluate for abnormal enhancement. EXAM: MRI HEAD WITH CONTRAST TECHNIQUE: Multiplanar, multiecho pulse sequences of the brain and surrounding structures were obtained with intravenous contrast. CONTRAST:  8mL GADAVIST GADOBUTROL 1 MMOL/ML IV SOLN COMPARISON:  Prior brain MRI from earlier the same day. FINDINGS: Brain: Postcontrast imaging of the brain demonstrates no abnormal enhancement. Specifically, previously identified white matter lesion involving the right temporal lobe does not enhance. No other new findings. Vascular: Normal intravascular enhancement seen throughout the  brain. Skull and upper cervical spine: No new findings. Sinuses/Orbits: No new findings. Other: None. IMPRESSION: No abnormal intracranial enhancement. Specifically, the previously identified white matter lesion involving the right temporal lobe does not enhance. Electronically Signed   By: Rise Mu M.D.   On: 10/24/2023 03:58   MR MRV HEAD W WO CONTRAST Result Date: 10/24/2023 CLINICAL DATA:  Initial evaluation for neuro deficit, headache, stroke. EXAM: MR VENOGRAM HEAD WITHOUT AND WITH CONTRAST TECHNIQUE: Angiographic images of the intracranial venous structures were acquired using MRV technique without and with  intravenous contrast. CONTRAST:  8mL GADAVIST GADOBUTROL 1 MMOL/ML IV SOLN COMPARISON:  Concomitant brain MRI. FINDINGS: Normal flow related signal and enhancement seen throughout the superior sagittal sinus to the torcula. Transverse and sigmoid sinuses are patent as are the jugular bulbs and visualized proximal internal jugular veins. Straight sinus, vein of Galen, internal cerebral veins, and basal veins of Rosenthal are patent. No evidence for dural venous sinus thrombosis. No appreciable cortical vein abnormality. No other visible pathologic enhancement within the brain. IMPRESSION: Normal intracranial MRV. No evidence for dural venous sinus thrombosis. Electronically Signed   By: Rise Mu M.D.   On: 10/24/2023 03:02   MR BRAIN WO CONTRAST Result Date: 10/24/2023 CLINICAL DATA:  Initial evaluation for headache, neuro deficit, stroke. EXAM: MRI HEAD WITHOUT CONTRAST TECHNIQUE: Multiplanar, multiecho pulse sequences of the brain and surrounding structures were obtained without intravenous contrast. COMPARISON:  Prior CT from earlier the same day. FINDINGS: Brain: Cerebral volume within normal limits. Single 12 mm focus of FLAIR hyperintensity noted involving the periventricular in right temporal lobe (series 11, image 22), nonspecific. No other focal parenchymal signal abnormality or significant cerebral white matter disease. No evidence for acute or subacute ischemia. Gray-white matter differentiation maintained. No areas of chronic cortical infarction. No acute or chronic intracranial blood products. No mass lesion, midline shift or mass effect. No hydrocephalus or extra-axial fluid collection. Pituitary gland and suprasellar region within normal limits. Vascular: Major intracranial vascular flow voids are maintained. Skull and upper cervical spine: Craniocervical junction within normal limits. Bone marrow signal intensity normal. No scalp soft tissue abnormality. Sinuses/Orbits: Globes orbital  soft tissues within normal limits. Paranasal sinuses are clear. Trace right mastoid effusion noted, of doubtful significance. Other: None. IMPRESSION: 1. No acute intracranial abnormality. 2. Single 12 mm focus of FLAIR hyperintensity involving the periventricular right temporal lobe. Finding is nonspecific, with primary differential considerations including a remote white matter insult or focus of demyelination. 3. Otherwise normal brain MRI. Electronically Signed   By: Rise Mu M.D.   On: 10/24/2023 03:01   CT HEAD CODE STROKE WO CONTRAST Result Date: 10/24/2023 CLINICAL DATA:  Code stroke. Initial evaluation for acute neuro deficit, stroke suspected. EXAM: CT HEAD WITHOUT CONTRAST TECHNIQUE: Contiguous axial images were obtained from the base of the skull through the vertex without intravenous contrast. RADIATION DOSE REDUCTION: This exam was performed according to the departmental dose-optimization program which includes automated exposure control, adjustment of the mA and/or kV according to patient size and/or use of iterative reconstruction technique. COMPARISON:  Prior study from 10/23/2015. FINDINGS: Brain: Cerebral volume within normal limits for patient age. No acute intracranial hemorrhage. No acute large vessel territory infarct. No mass lesion, midline shift, or mass effect. Ventricles are normal in size without hydrocephalus. No extra-axial fluid collection. Vascular: No abnormal hyperdense vessel. Skull: Scalp soft tissues demonstrate no acute abnormality. Calvarium intact. Sinuses/Orbits: Globes and orbital soft tissues within normal limits. Visualized paranasal sinuses are largely  clear. No significant mastoid effusion. ASPECTS [facility] Stroke Program Early CT Score) - Ganglionic level infarction (caudate, lentiform nuclei, internal capsule, insula, M1-M3 cortex): 7 - Supraganglionic infarction (M4-M6 cortex): 3 Total score (0-10 with 10 being normal): 10 IMPRESSION: 1. Negative head  CT.  No acute intracranial abnormality. 2. ASPECTS is 10. These results were communicated to Dr. Otelia Limes at 12:15 am on 10/24/2023 by text page via the [facility] messaging system. Electronically Signed   By: Rise Mu M.D.   On: 10/24/2023 00:17     Signed, Lorin Glass, MD [facility] 10/24/2023

## 2023-10-24 NOTE — Code Documentation (Signed)
Stroke Response Nurse Documentation Code Documentation  Kristin Jarvis is a 43 y.o. female arriving to [facility]  via [facility] on 1/20 with past medical hx of migraines, IBS. On No antithrombotic. Code stroke was activated by EMS.   Patient from home where she was LKW at 2215 and now complaining of headache and left sided weakness.    Stroke team at the bedside on patient arrival. Labs drawn and patient cleared for CT by Dr. Madilyn Hook. Patient to CT with team. NIHSS 5, see documentation for details and code stroke times. Patient with left arm weakness, left leg weakness, and left decreased sensation on exam. The following imaging was completed:  CT Head. Patient is not a candidate for IV Thrombolytic due to too mild to treat. Patient is not a candidate for IR due to low suspicion of stroke.    Bedside handoff with ED RN Trinna Post.    Rose Fillers  Rapid Response RN

## 2023-10-24 NOTE — Progress Notes (Signed)
  Echocardiogram 2D Echocardiogram has been performed.  Kristin Jarvis 10/24/2023, 2:33 PM

## 2023-10-24 NOTE — Progress Notes (Addendum)
STROKE TEAM PROGRESS NOTE   BRIEF HPI Ms. Kristin Jarvis is a 43 y.o. female with PMH significant for COVID-19, IBS, migraines and renal stone who was brought in by EMS from home as a Code Stroke for emergent assessment of acute onset right frontal headache with left sided weakness. Patient c/o headache, N/V, photophobia, LUE/LLE weakness. Denied aura. She is on daily Topamax.  On exam, giveaway weakness to left side was noted with possible psychogenic effect.  CTH, MRI with and without, MRV negative. THC positive, hypokalemic.  She was given migraine cocktail and fluids, no improvement noted afterwards.   NIH on Admission 5  INTERIM HISTORY/SUBJECTIVE CTA completed today, no LVO or significant stenosis. On exam patient reports mild improvement in headache symptoms.   OBJECTIVE  CBC    Component Value Date/Time   WBC 10.2 10/24/2023 0003   RBC 4.75 10/24/2023 0003   HGB 13.9 10/24/2023 0003   HGB 13.5 10/24/2023 0003   HCT 41.0 10/24/2023 0003   HCT 41.1 10/24/2023 0003   PLT 230 10/24/2023 0003   MCV 86.5 10/24/2023 0003   MCH 28.4 10/24/2023 0003   MCHC 32.8 10/24/2023 0003   RDW 13.5 10/24/2023 0003   LYMPHSABS 3.4 10/24/2023 0003   MONOABS 0.7 10/24/2023 0003   EOSABS 0.2 10/24/2023 0003   BASOSABS 0.1 10/24/2023 0003    BMET    Component Value Date/Time   NA 138 10/24/2023 0003   NA 137 10/24/2023 0003   K 3.1 (L) 10/24/2023 0003   K 3.1 (L) 10/24/2023 0003   CL 104 10/24/2023 0003   CL 105 10/24/2023 0003   CO2 21 (L) 10/24/2023 0003   GLUCOSE 115 (H) 10/24/2023 0003   GLUCOSE 124 (H) 10/24/2023 0003   BUN 16 10/24/2023 0003   BUN 16 10/24/2023 0003   CREATININE 1.20 (H) 10/24/2023 0003   CREATININE 1.02 (H) 10/24/2023 0003   CALCIUM 9.4 10/24/2023 0003   GFRNONAA >60 10/24/2023 0003    IMAGING past 24 hours CT ANGIO HEAD NECK W WO CM Result Date: 10/24/2023 CLINICAL DATA:  Acute neuro deficit, stroke suspected EXAM: CT ANGIOGRAPHY HEAD AND NECK WITH  AND WITHOUT CONTRAST TECHNIQUE: Multidetector CT imaging of the head and neck was performed using the standard protocol during bolus administration of intravenous contrast. Multiplanar CT image reconstructions and MIPs were obtained to evaluate the vascular anatomy. Carotid stenosis measurements (when applicable) are obtained utilizing NASCET criteria, using the distal internal carotid diameter as the denominator. RADIATION DOSE REDUCTION: This exam was performed according to the departmental dose-optimization program which includes automated exposure control, adjustment of the mA and/or kV according to patient size and/or use of iterative reconstruction technique. CONTRAST:  75mL OMNIPAQUE IOHEXOL 350 MG/ML SOLN COMPARISON:  10/24/2023 CT head, no prior CTA available FINDINGS: CT HEAD FINDINGS For noncontrast findings, please see same day CT head. CTA NECK FINDINGS Aortic arch: Standard branching. Imaged portion shows no evidence of aneurysm or dissection. No significant stenosis of the major arch vessel origins. Right carotid system: No evidence of dissection, occlusion, or hemodynamically significant stenosis (greater than 50%). Left carotid system: No evidence of dissection, occlusion, or hemodynamically significant stenosis (greater than 50%). Vertebral arteries: Evaluation of the distal V2 segments is somewhat limited by beam hardening artifact from the patient's dental hardware. Within this limitation, no evidence of dissection, occlusion, or hemodynamically significant stenosis (greater than 50%). Skeleton: No acute osseous abnormality. Other neck: No acute finding. Upper chest: No focal pulmonary opacity or pleural effusion. Review of  the MIP images confirms the above findings CTA HEAD FINDINGS Anterior circulation: Both internal carotid arteries are patent to the termini, without significant stenosis. A1 segments patent. Normal anterior communicating artery. Anterior cerebral arteries are patent to their  distal aspects without significant stenosis. No M1 stenosis or occlusion. MCA branches perfused to their distal aspects without significant stenosis. Posterior circulation: Vertebral arteries patent to the vertebrobasilar junction without significant stenosis. Posterior inferior cerebellar arteries patent proximally. Basilar patent to its distal aspect without significant stenosis. Superior cerebellar arteries patent proximally. Patent P1 segments, diminutive on the right. Near fetal origin of the right PCA from the right posterior communicating artery. The left posterior communicating artery is also patent. PCAs perfused to their distal aspects. Venous sinuses: Well opacified. Patent. Anatomic variants: Near fetal origin of the right PCA. No evidence of aneurysm or vascular malformation. Review of the MIP images confirms the above findings IMPRESSION: 1. No intracranial large vessel occlusion or significant stenosis. 2. No hemodynamically significant stenosis in the neck. Electronically Signed   By: Wiliam Ke M.D.   On: 10/24/2023 12:09   MR BRAIN W CONTRAST Result Date: 10/24/2023 CLINICAL DATA:  Evaluate for abnormal enhancement. EXAM: MRI HEAD WITH CONTRAST TECHNIQUE: Multiplanar, multiecho pulse sequences of the brain and surrounding structures were obtained with intravenous contrast. CONTRAST:  8mL GADAVIST GADOBUTROL 1 MMOL/ML IV SOLN COMPARISON:  Prior brain MRI from earlier the same day. FINDINGS: Brain: Postcontrast imaging of the brain demonstrates no abnormal enhancement. Specifically, previously identified white matter lesion involving the right temporal lobe does not enhance. No other new findings. Vascular: Normal intravascular enhancement seen throughout the brain. Skull and upper cervical spine: No new findings. Sinuses/Orbits: No new findings. Other: None. IMPRESSION: No abnormal intracranial enhancement. Specifically, the previously identified white matter lesion involving the right  temporal lobe does not enhance. Electronically Signed   By: Rise Mu M.D.   On: 10/24/2023 03:58   MR MRV HEAD W WO CONTRAST Result Date: 10/24/2023 CLINICAL DATA:  Initial evaluation for neuro deficit, headache, stroke. EXAM: MR VENOGRAM HEAD WITHOUT AND WITH CONTRAST TECHNIQUE: Angiographic images of the intracranial venous structures were acquired using MRV technique without and with intravenous contrast. CONTRAST:  8mL GADAVIST GADOBUTROL 1 MMOL/ML IV SOLN COMPARISON:  Concomitant brain MRI. FINDINGS: Normal flow related signal and enhancement seen throughout the superior sagittal sinus to the torcula. Transverse and sigmoid sinuses are patent as are the jugular bulbs and visualized proximal internal jugular veins. Straight sinus, vein of Galen, internal cerebral veins, and basal veins of Rosenthal are patent. No evidence for dural venous sinus thrombosis. No appreciable cortical vein abnormality. No other visible pathologic enhancement within the brain. IMPRESSION: Normal intracranial MRV. No evidence for dural venous sinus thrombosis. Electronically Signed   By: Rise Mu M.D.   On: 10/24/2023 03:02   MR BRAIN WO CONTRAST Result Date: 10/24/2023 CLINICAL DATA:  Initial evaluation for headache, neuro deficit, stroke. EXAM: MRI HEAD WITHOUT CONTRAST TECHNIQUE: Multiplanar, multiecho pulse sequences of the brain and surrounding structures were obtained without intravenous contrast. COMPARISON:  Prior CT from earlier the same day. FINDINGS: Brain: Cerebral volume within normal limits. Single 12 mm focus of FLAIR hyperintensity noted involving the periventricular in right temporal lobe (series 11, image 22), nonspecific. No other focal parenchymal signal abnormality or significant cerebral white matter disease. No evidence for acute or subacute ischemia. Gray-white matter differentiation maintained. No areas of chronic cortical infarction. No acute or chronic intracranial blood  products. No mass lesion, midline  shift or mass effect. No hydrocephalus or extra-axial fluid collection. Pituitary gland and suprasellar region within normal limits. Vascular: Major intracranial vascular flow voids are maintained. Skull and upper cervical spine: Craniocervical junction within normal limits. Bone marrow signal intensity normal. No scalp soft tissue abnormality. Sinuses/Orbits: Globes orbital soft tissues within normal limits. Paranasal sinuses are clear. Trace right mastoid effusion noted, of doubtful significance. Other: None. IMPRESSION: 1. No acute intracranial abnormality. 2. Single 12 mm focus of FLAIR hyperintensity involving the periventricular right temporal lobe. Finding is nonspecific, with primary differential considerations including a remote white matter insult or focus of demyelination. 3. Otherwise normal brain MRI. Electronically Signed   By: Rise Mu M.D.   On: 10/24/2023 03:01   CT HEAD CODE STROKE WO CONTRAST Result Date: 10/24/2023 CLINICAL DATA:  Code stroke. Initial evaluation for acute neuro deficit, stroke suspected. EXAM: CT HEAD WITHOUT CONTRAST TECHNIQUE: Contiguous axial images were obtained from the base of the skull through the vertex without intravenous contrast. RADIATION DOSE REDUCTION: This exam was performed according to the departmental dose-optimization program which includes automated exposure control, adjustment of the mA and/or kV according to patient size and/or use of iterative reconstruction technique. COMPARISON:  Prior study from 10/23/2015. FINDINGS: Brain: Cerebral volume within normal limits for patient age. No acute intracranial hemorrhage. No acute large vessel territory infarct. No mass lesion, midline shift, or mass effect. Ventricles are normal in size without hydrocephalus. No extra-axial fluid collection. Vascular: No abnormal hyperdense vessel. Skull: Scalp soft tissues demonstrate no acute abnormality. Calvarium intact.  Sinuses/Orbits: Globes and orbital soft tissues within normal limits. Visualized paranasal sinuses are largely clear. No significant mastoid effusion. ASPECTS [facility] Stroke Program Early CT Score) - Ganglionic level infarction (caudate, lentiform nuclei, internal capsule, insula, M1-M3 cortex): 7 - Supraganglionic infarction (M4-M6 cortex): 3 Total score (0-10 with 10 being normal): 10 IMPRESSION: 1. Negative head CT.  No acute intracranial abnormality. 2. ASPECTS is 10. These results were communicated to Dr. Otelia Limes at 12:15 am on 10/24/2023 by text page via the [facility] messaging system. Electronically Signed   By: Rise Mu M.D.   On: 10/24/2023 00:17    Vitals:   10/24/23 0800 10/24/23 0900 10/24/23 1100 10/24/23 1230  BP: 98/66 105/66 99/70   Pulse: 73 65 72   Resp: 11 12 18    Temp: 97.9 F (36.6 C)   98.3 F (36.8 C)  TempSrc: Oral   Oral  SpO2: 97% 98% 99%   Weight:         PHYSICAL EXAM General:  Alert, well-nourished, well-developed patient in no acute distress Psych:  Mood and affect appropriate for situation CV: Regular rate and rhythm on monitor Respiratory:  Regular, unlabored respirations on room air GI: Abdomen soft and nontender   NEURO:  Mental Status: AA&Ox3, patient is able to give clear and coherent history Speech/Language: speech is without dysarthria or aphasia.  Naming, repetition, fluency, and comprehension intact.  Cranial Nerves:  II: PERRL. Visual fields full.  III, IV, VI: EOMI. Eyelids elevate symmetrically.  V: Sensation is intact to light touch and symmetrical to face.  VII: Face is symmetrical resting and smiling VIII: hearing intact to voice. IX, X: Palate elevates symmetrically. Phonation is normal.  OZ:[id number] shrug 5/5. XII: tongue is midline without fasciculations. Motor: 5/5 strength to all muscle groups tested.   Tone: is normal and bulk is normal Sensation- decreased on left arm Coordination: FTN intact bilaterally, HKS: no  ataxia in BLE.No drift.  Gait- deferred  Most Recent NIH: 1    ASSESSMENT/PLAN  Most likely complicated Migraine  Code Stroke CT head Negative. No acute intracranial abnormality. ASPECTS is 10. Marland Kitchen CT ANGIO head and neck unremarkable     MRI no contrast no acute intracranial abnormality. Single 12 mm focus of FLAIR hyperintensity involving the periventricular right temporal lobe. Finding is nonspecific MRI and with contrast no abnormal enhancement MRV: Negative. No evidence for dual venous sinus thrombosis 2D ECHO: EF 55 to 60% LDL 131 HgbA1c 5.5 UDS positive for THC VTE prophylaxis -Lovenox No antithrombotic prior to admission, now on aspirin 81 mg daily for stroke prevention Therapy recommendations:  Pending Disposition:  pending  Hx of Migraines Home Meds: Topamax 200mg , Ubrelvy 100 Continue home meds PRN Toradol, Zofran, Compazine Follow-up with outpatient neurology - NP Scales  Hyperlipidemia Home meds:  none LDL 131, goal < 70 Add Crestor 20mg   Continue statin at discharge  Substance Abuse Patient uses Cannabis gummies nightly for insomnia UDS positive for  THC   Other stroke risk factors Former smoker  Hospital day # 0   Pt seen by Neuro NP/APP and later by MD. Note/plan to be edited by MD as needed.    Lynnae January, DNP, AGACNP-BC [facility] Please use AMION for contact information & EPIC for messaging.  ATTENDING NOTE: I reviewed above note and agree with the assessment and plan. Pt was seen and examined.   Husband at bedside.  Patient lying bed, lethargic, complaining headache but improved overnight.  Stated that she had a history of migraine, on Topamax and Ubrelvy.  Normally short lasting is better after taking abortive medication soon after headache started.  At this time headache lasting longer associated with neurological symptoms.  Currently stroke workup negative, condition consistent with complicated migraine.  Continue headache  management per primary team, needed continue outpatient follow-up with neurology as outpatient.  On aspirin and Crestor.  For detailed assessment and plan, please refer to above/below as I have made changes wherever appropriate.   Neurology will sign off. Please call with questions. Pt will follow up with outpatient neurology NP Scales in about 2 weeks after discharge. Thanks for the consult.   Marvel Plan, MD PhD Stroke Neurology 10/24/2023 7:59 PM   spent additional 30-minute inpatient time with the patient, more than 50% of which was spent in counseling and coordination of care, reviewing test results, images and medication, and discussing the diagnosis, treatment plan and potential prognosis. This patient's care requiresreview of multiple databases, neurological assessment, discussion with family, other specialists and medical decision making of high complexity.      To contact Stroke Continuity provider, please refer to [email or web address]. After hours, contact General Neurology

## 2023-10-24 NOTE — ED Triage Notes (Signed)
Pt brought in by EMS from home with reports of Code Stroke. Pt has hx of migraines and is on topamax daily. LKW 2215. Pt began having frontal migraine with mild nausea, light sensitivity and left sided weakness/decreased sensation @ 2245. Pt is A&Ox4, VSS, no facial droop, no slurred speech. No history of complex migraine.

## 2023-10-24 NOTE — Consult Note (Incomplete)
NEUROLOGY CONSULT NOTE   Date of service: October 24, 2023 Patient Name: Kristin Jarvis MRN:  [id number] DOB:  Oct 07, 1980 Chief Complaint: Right frontal headache and left sided weakness Requesting Provider: No att. providers found  History of Present Illness  Kristin Jarvis is a 43 y.o. female with a past medical history of COVID-19, IBS (irritable bowel syndrome), migraine headaches and renal stone who was brought in by EMS from home as a Code Stroke for emergent assessment of acute onset right frontal headache with left sided weakness. Together with the headache, the patient endorses nausea without vomiting, photophobia, left upper and lower extremity weakness and left sided paresthesias. She denies osmophobia and sonophobia. She did not experience any preceding aura and states that she has no prior history of complicated migraine. She is on daily Topamax for migraine prevention. LKW 2215. Symptoms were first noticed at 2245.   She denies any vision or speech changes.   LKW: 2215 Modified rankin score: 0-Completely asymptomatic and back to baseline post- stroke IV Thrombolysis:  No: Symptoms too mild to treat EVT: No: Presentation not consistent with LVO   NIHSS components Score: Comment  1a Level of Conscious 0[x]  1[]  2[]  3[]      1b LOC Questions 0[x]  1[]  2[]       1c LOC Commands 0[x]  1[]  2[]       2 Best Gaze 0[x]  1[]  2[]       3 Visual 0[x]  1[]  2[]  3[]      4 Facial Palsy 0[x]  1[]  2[]  3[]      5a Motor Arm - left 0[]  1[]  2[x]  3[]  4[]  UN[]   Giveway weakness  5b Motor Arm - Right 0[x]  1[]  2[]  3[]  4[]  UN[]    6a Motor Leg - Left 0[]  1[]  2[x]  3[]  4[]  UN[]   Giveway weakness  6b Motor Leg - Right 0[x]  1[]  2[]  3[]  4[]  UN[]    7 Limb Ataxia 0[x]  1[]  2[]  3[]  UN[]     8 Sensory 0[]  1[x]  2[]  UN[]     Dysesthesia to left face, arm and leg  9 Best Language 0[x]  1[]  2[]  3[]      10 Dysarthria 0[x]  1[]  2[]  UN[]      11 Extinct. and Inattention 0[x]  1[]  2[]       TOTAL:    5      ROS  As per HPI.  Detailed ROS deferred in the context of acuity of presentation.    Past History   Past Medical History:  Diagnosis Date   COVID-19    IBS (irritable bowel syndrome)    Renal stone     Past Surgical History:  Procedure Laterality Date   ABDOMINAL HYSTERECTOMY     COLONOSCOPY     INCONTINENCE SURGERY     INCONTINENCE SURGERY     SHOULDER SURGERY     TONSILLECTOMY      Family History: No family history on file.  Social History  reports that she has quit smoking. Her smoking use included cigarettes. She has never used smokeless tobacco. She reports that she does not currently use alcohol. She reports that she does not currently use drugs.  Allergies  Allergen Reactions   Acetaminophen    Pyridium [Phenazopyridine Hcl]    Sulfonamide Derivatives     Medications  No current facility-administered medications for this encounter.  Current Outpatient Medications:    Alum & Mag Hydroxide-Simeth (GI COCKTAIL) SUSP suspension, Take 30 mLs by mouth 2 (two) times daily as needed for indigestion. Shake well., Disp: 150 mL, Rfl: 0   Ascorbic Acid (VITAMIN  C) 100 MG tablet, Take by mouth., Disp: , Rfl:    Ascorbic Acid (VITAMIN C) 500 MG CHEW, Chew by mouth., Disp: , Rfl:    b complex vitamins tablet, Take by mouth., Disp: , Rfl:    baclofen (LIORESAL) 10 MG tablet, Take by mouth., Disp: , Rfl:    benzonatate (TESSALON) 100 MG capsule, Take 1 capsule (100 mg total) by mouth 3 (three) times daily as needed for cough., Disp: 21 capsule, Rfl: 0   cefdinir (OMNICEF) 300 MG capsule, Take 1 capsule (300 mg total) by mouth 2 (two) times daily., Disp: 16 capsule, Rfl: 0   ciprofloxacin (CIPRO) 500 MG tablet, Take 1 tablet (500 mg total) by mouth 2 (two) times daily., Disp: 14 tablet, Rfl: 0   dicyclomine (BENTYL) 20 MG tablet, Take 1 tablet (20 mg total) by mouth 2 (two) times daily., Disp: 20 tablet, Rfl: 0   eletriptan (RELPAX) 40 MG tablet, Take by mouth., Disp: , Rfl:    famotidine  (PEPCID) 20 MG tablet, Take by mouth., Disp: , Rfl:    Galcanezumab-gnlm 120 MG/ML SOAJ, Inject into the skin., Disp: , Rfl:    HYDROcodone-acetaminophen (NORCO/VICODIN) 5-325 MG tablet, Take 1-2 tablets by mouth every 6 (six) hours as needed., Disp: 15 tablet, Rfl: 0   ibuprofen (ADVIL,MOTRIN) 800 MG tablet, Take 1 tablet (800 mg total) by mouth 3 (three) times daily., Disp: 21 tablet, Rfl: 0   Magnesium 250 MG TABS, Take by mouth., Disp: , Rfl:    Multiple Vitamins-Minerals (MULTIVITAMIN ADULTS) TABS, Take by mouth., Disp: , Rfl:    norethindrone-ethinyl estradiol (MICROGESTIN,JUNEL,LOESTRIN) 1-20 MG-MCG tablet, TAKE 1 TABLET BY MOUTH DAILY. PT TAKES THIS IN A CONTINOUS FASHION SKIPPING SUGAR PILLS., Disp: , Rfl:    omeprazole (PRILOSEC) 40 MG capsule, Take by mouth., Disp: , Rfl:    ondansetron (ZOFRAN) 4 MG tablet, Take 1 tablet (4 mg total) by mouth every 6 (six) hours., Disp: 12 tablet, Rfl: 0   polyethylene glycol (MIRALAX / GLYCOLAX) packet, Take by mouth., Disp: , Rfl:    promethazine (PHENERGAN) 12.5 MG tablet, Take 1 or 2 tabs as needed for nausea up to three times a day, Disp: , Rfl:    Topiramate ER (TROKENDI XR) 200 MG CP24, TAKE ONE CAPSULE (200 MG DOSE) BY MOUTH DAILY., Disp: , Rfl:    verapamil (VERELAN) 100 MG 24 hr capsule, Take by mouth., Disp: , Rfl:   Vitals   Vitals:   10/24/23 0000  Weight: 92.6 kg    Body mass index is 27.69 kg/m.  Physical Exam   Physical Exam  HEENT:  [place]/AT  Lungs: Respirations unlabored Extremities: Warm and well perfused.   Neurological Examination Mental Status: Awake and alert. Oriented x 5. Speech fluent with intact naming and comprehension. Keeps eyes closed.  Cranial Nerves: II: Visual fields intact bilaterally.    III,IV, VI: No ptosis. EOMI. No nystagmus.  V: Temp and FT sensation decreased on the left VII: Smile symmetric VIII: Hearing intact to conversation IX,X: No hoarseness or hypophonia XI: Symmetric XII: Midline  tongue extension Motor: LUE and LLE with 4/5 giveway weakness RUE and RLE 5/5 Sensory: Decreased temp and light touch to LUE and LLE. No extinction to DSS.  Deep Tendon Reflexes: 2+ and symmetric throughout Cerebellar: No ataxia with FNF bilaterally Gait: Deferred   Labs/Imaging/Neurodiagnostic studies   CBC: No results for input(s): "WBC", "NEUTROABS", "HGB", "HCT", "MCV", "PLT" in the last 168 hours. Basic Metabolic Panel:  Lab Results  Component  Value Date   NA 138 06/17/2021   K 3.8 06/17/2021   CO2 19 (L) 06/17/2021   GLUCOSE 103 (H) 06/17/2021   BUN 13 06/17/2021   CREATININE 0.80 06/17/2021   CALCIUM 8.9 06/17/2021   GFRNONAA >60 06/17/2021   GFRAA >60 12/22/2019       ASSESSMENT  43 y.o. female with a past medical history of COVID-19, IBS (irritable bowel syndrome), migraine headaches and renal stone who was brought in by EMS from home as a Code Stroke for emergent assessment of acute onset right frontal headache with left sided weakness. Together with the headache, the patient endorses nausea without vomiting, photophobia, left upper and lower extremity weakness and left sided paresthesias. She denies osmophobia and sonophobia. She did not experience any preceding aura and states that she has no prior history of complicated migraine. She is on daily Topamax for migraine prevention. LKW 2215. Symptoms were first noticed at 2245. - Exam reveals giveway weakness on the left. Suspect possible psychogenic component.  - CT head: Negative head CT.  No acute intracranial abnormality. ASPECTS is 10. - MRI brain: No acute intracranial abnormality. Single 12 mm focus of FLAIR hyperintensity involving the periventricular right temporal lobe. Finding is nonspecific, with primary differential considerations including a remote white matter insult or focus of demyelination. Otherwise normal brain MRI. - MRV of brain: Normal intracranial MRV. No evidence for dural venous sinus  thrombosis. - THC positive - Hypokalemic on CMP - DDx includes complicated migraine and psychogenic pseudostroke.    RECOMMENDATIONS  - MRI finding will need to be further evaluated with post-contrast MRI this morning. If negative for enhancement, this should be followed up outpatient. Most likely will need repeat MRI in 1 year to assess for stability.  - Outpatient follow up with [facility] or [facility].  - Correct her potassium level - Bolus with 1 L IV NS, then start continuous IV infusion at 125 mL/hr - After above bolus is completed, administer 10 mg IV Compazine x 1 - Frequent neuro checks.    Addendum: - MRI brain with contrast follow up study: No abnormal intracranial enhancement. Specifically, the previously identified white matter lesion involving the right temporal lobe does not enhance. - Headache continued to be uncontrolled ("12/10") and still numb/weak. EDP has called Internal Medicine Team to admit.  ______________________________________________________________________    Dessa Phi, Henretter Piekarski, MD [facility]

## 2023-10-25 DIAGNOSIS — G43109 Migraine with aura, not intractable, without status migrainosus: Secondary | ICD-10-CM | POA: Diagnosis not present

## 2023-10-25 LAB — BASIC METABOLIC PANEL
Anion gap: 5 (ref 5–15)
BUN: 10 mg/dL (ref 6–20)
CO2: 22 mmol/L (ref 22–32)
Calcium: 8.6 mg/dL — ABNORMAL LOW (ref 8.9–10.3)
Chloride: 109 mmol/L (ref 98–111)
Creatinine, Ser: 0.9 mg/dL (ref 0.44–1.00)
GFR, Estimated: >60 mL/min (ref >60)
Glucose, Bld: 101 mg/dL — ABNORMAL HIGH (ref 70–99)
Potassium: 4.1 mmol/L (ref 3.5–5.1)
Sodium: 136 mmol/L (ref 135–145)

## 2023-10-25 LAB — CBC
HCT: 35 % — ABNORMAL LOW (ref 36.0–46.0)
Hemoglobin: 11.4 g/dL — ABNORMAL LOW (ref 12.0–15.0)
MCH: 28.4 pg (ref 26.0–34.0)
MCHC: 32.6 g/dL (ref 30.0–36.0)
MCV: 87.3 fL (ref 80.0–100.0)
Platelets: 197 10*3/uL (ref 150–400)
RBC: 4.01 MIL/uL (ref 3.87–5.11)
RDW: 13.8 % (ref 11.5–15.5)
WBC: 5.4 10*3/uL (ref 4.0–10.5)
nRBC: 0 % (ref 0.0–0.2)

## 2023-10-25 LAB — HIV ANTIBODY (ROUTINE TESTING W REFLEX): HIV Screen 4th Generation wRfx: NONREACTIVE

## 2023-10-25 MED ORDER — ASPIRIN 81 MG PO TBEC: 81.0000 mg | DELAYED_RELEASE_TABLET | Freq: Every day | ORAL | Status: AC

## 2023-10-25 NOTE — Plan of Care (Signed)
Pt has rested quietly throughout the night with no distress noted. Alert and oriented. On room air. SR on the monitor. Up to BR to void with assist from husband. Has been medicated for pain with oxy twice with relief noted. Medicated once with zofran for nausea with relief noted. Pt concerned over hands swelling and did not want the new IV bag hung when the old was empty. No other complaints voiced.     Problem: Education: Goal: Knowledge of General Education information will improve Description: Including pain rating scale, medication(s)/side effects and non-pharmacologic comfort measures Outcome: Progressing   Problem: Activity: Goal: Risk for activity intolerance will decrease Outcome: Progressing   Problem: Nutrition: Goal: Adequate nutrition will be maintained Outcome: Progressing   Problem: Pain Managment: Goal: General experience of comfort will improve and/or be controlled Outcome: Progressing

## 2023-10-25 NOTE — Discharge Summary (Signed)
PATIENT DETAILS Name: Kristin Jarvis Age: 43 y.o. Sex: female Date of Birth: 21-Mar-1981 MRN: [id number]. Admitting Physician: Lorin Glass, MD [id number], Deloria Lair, PA-C  Admit Date: 10/23/2023 Discharge date: 10/25/2023  Recommendations for Outpatient Follow-up:  Follow up with PCP in 1-2 weeks Please obtain CMP/CBC in one week  Admitted From:  Home  Disposition: Home   Discharge Condition: good  CODE STATUS:   Code Status: Full Code   Diet recommendation:  Diet Order             Diet - low sodium heart healthy           Diet regular Fluid consistency: Thin  Diet effective now                    Brief Summary: 43 year old with history of migraine headaches-presented with right frontal headache and left-sided weakness.  She underwent extensive workup including-neuroimaging-ultimately it was felt that her symptomatology was consistent with complicated migraine.  Significant studies 1/21>> MRI brain: No acute CVA 1/21>> MRV brain: No sinus thrombosis 1/21>> CT angio head/neck: No significant stenosis/LVO 1/21>> echo: EF 55-60% 1/21>> A1c: 5.5 1/21>> LDL: 131 1/21>> UDS:+ve tetrahydrocannabinol  Brief Hospital Course: Complicated migraine Stroke workup negative-completed-see above Headache minimal-continue Topamax and Ubrelvy Noted acute CVA ruled out.  Hyperlipidemia LDL elevated-but patient has no other risk factors-she wants to talk with her primary care practitioner-perhaps get a heart calcium score before starting statins.   Discharge Diagnoses:  Principal Problem:   Migraine, hemiplegic   Discharge Instructions:  Activity:  As tolerated   Discharge Instructions     Diet - low sodium heart healthy   Complete by: As directed    Discharge instructions   Complete by: As directed    Follow with Primary MD  Piedad Climes, [place], PA-C in 1-2 weeks  Please get a complete blood count and chemistry panel checked by your Primary MD at  your next visit, and again as instructed by your Primary MD.  Get Medicines reviewed and adjusted: Please take all your medications with you for your next visit with your Primary MD  Laboratory/radiological data: Please request your Primary MD to go over all hospital tests and procedure/radiological results at the follow up, please ask your Primary MD to get all Hospital records sent to his/her office.  In some cases, they will be blood work, cultures and biopsy results pending at the time of your discharge. Please request that your primary care M.D. follows up on these results.  Also Note the following: If you experience worsening of your admission symptoms, develop shortness of breath, life threatening emergency, suicidal or homicidal thoughts you must seek medical attention immediately by calling 911 or calling your MD immediately  if symptoms less severe.  You must read complete instructions/literature along with all the possible adverse reactions/side effects for all the Medicines you take and that have been prescribed to you. Take any new Medicines after you have completely understood and accpet all the possible adverse reactions/side effects.   Do not drive when taking Pain medications or sleeping medications (Benzodaizepines)  Do not take more than prescribed Pain, Sleep and Anxiety Medications. It is not advisable to combine anxiety,sleep and pain medications without talking with your primary care practitioner  Special Instructions: If you have smoked or chewed Tobacco  in the last 2 yrs please stop smoking, stop any regular Alcohol  and or any Recreational drug use.  Wear Seat belts while driving.  Please note:  You were cared for by a hospitalist during your hospital stay. Once you are discharged, your primary care physician will handle any further medical issues. Please note that NO REFILLS for any discharge medications will be authorized once you are discharged, as it is  imperative that you return to your primary care physician (or establish a relationship with a primary care physician if you do not have one) for your post hospital discharge needs so that they can reassess your need for medications and monitor your lab values.   Increase activity slowly   Complete by: As directed       Allergies as of 10/25/2023       Reactions   Acetaminophen    Causes migraines   Pyridium [phenazopyridine Hcl]    Unknown reaction   Sulfonamide Derivatives    Unknown reaction        Medication List     TAKE these medications    amitriptyline 25 MG tablet Commonly known as: ELAVIL Take 25 mg by mouth at bedtime.   aspirin EC 81 MG tablet Take 1 tablet (81 mg total) by mouth daily. Swallow whole.   Calcium Magnesium Zinc 333-133-5 MG Tabs Take 1 tablet by mouth daily.   citalopram 20 MG tablet Commonly known as: CELEXA Take 30 mg by mouth daily.   diphenhydrAMINE 25 MG tablet Commonly known as: BENADRYL Take 50 mg by mouth every 6 (six) hours as needed for sleep or allergies.   famotidine 20 MG tablet Commonly known as: PEPCID Take 20 mg by mouth at bedtime.   fluticasone 50 MCG/ACT nasal spray Commonly known as: FLONASE Place 1 spray into both nostrils daily as needed for allergies or rhinitis.   ibuprofen 200 MG tablet Commonly known as: ADVIL Take 800 mg by mouth every 6 (six) hours as needed for moderate pain (pain score 4-6).   Multivitamin Adults Tabs Take 1 tablet by mouth daily.   omeprazole 40 MG capsule Commonly known as: PRILOSEC Take 40 mg by mouth 2 (two) times daily.   ondansetron 4 MG disintegrating tablet Commonly known as: ZOFRAN-ODT Take 4 mg by mouth every 8 (eight) hours as needed for nausea or vomiting.   polyethylene glycol 17 g packet Commonly known as: MIRALAX / GLYCOLAX Take 17 g by mouth daily.   promethazine 25 MG tablet Commonly known as: PHENERGAN Take 25 mg by mouth every 8 (eight) hours as needed for  nausea or vomiting.   Trokendi XR 200 MG Cp24 Generic drug: Topiramate ER Take 200 mg by mouth daily.   Ubrelvy 100 MG Tabs Generic drug: Ubrogepant Take 100 mg by mouth every 2 (two) hours as needed (for migraines).   Vitamin C 500 MG Chew Chew 500 mg by mouth at bedtime.        Follow-up Information     Scales, [place], FNP. Schedule an appointment as soon as possible for a visit in 2 week(s).   Specialty: Nurse Practitioner Contact information: [address] [place] [address] [phone]         [place], [place], [place]. Schedule an appointment as soon as possible for a visit in 1 week(s).   Specialty: Family Medicine Contact information: [phone] [place] [address] [phone]                Allergies  Allergen Reactions   Acetaminophen     Causes migraines   Pyridium [Phenazopyridine Hcl]     Unknown reaction  Sulfonamide Derivatives     Unknown reaction      Other Procedures/Studies: ECHOCARDIOGRAM COMPLETE Result Date: 10/24/2023    ECHOCARDIOGRAM REPORT   Patient Name:   BETTSY MCCRUM Date of Exam: 10/24/2023 Medical Rec #:  [id number]      Height:       72.0 in Accession #:    [id number]     Weight:       178.0 lb Date of Birth:  November 14, 1980      BSA:          2.028 m Patient Age:    42 years       BP:           99/70 mmHg Patient Gender: F              HR:           66 bpm. Exam Location:  Inpatient Procedure: 2D Echo, Cardiac Doppler, Color Doppler, 3D Echo and Strain Analysis Indications:    Stroke  History:        Patient has no prior history of Echocardiogram examinations.                 Signs/Symptoms:Chest Pain, Dyspnea and Shortness of Breath.  Sonographer:    Sheralyn Boatman RDCS Referring Phys: [id number] Marvel Plan IMPRESSIONS  1. Left ventricular ejection fraction, by estimation, is 55 to 60%. Left ventricular ejection fraction by 3D volume is 53 %. The left ventricle has normal function. The left  ventricle has no regional wall motion abnormalities. Left ventricular diastolic  parameters were normal. The average left ventricular global longitudinal strain is -22.2 %. The global longitudinal strain is normal.  2. Right ventricular systolic function is normal. The right ventricular size is normal.  3. The mitral valve is normal in structure. No evidence of mitral valve regurgitation. No evidence of mitral stenosis.  4. The aortic valve is normal in structure. Aortic valve regurgitation is not visualized. No aortic stenosis is present.  5. The inferior vena cava is normal in size with greater than 50% respiratory variability, suggesting right atrial pressure of 3 mmHg. Conclusion(s)/Recommendation(s): No intracardiac source of embolism detected on this transthoracic study. Consider a transesophageal echocardiogram to exclude cardiac source of embolism if clinically indicated. FINDINGS  Left Ventricle: Left ventricular ejection fraction, by estimation, is 55 to 60%. Left ventricular ejection fraction by 3D volume is 53 %. The left ventricle has normal function. The left ventricle has no regional wall motion abnormalities. The average left ventricular global longitudinal strain is -22.2 %. The global longitudinal strain is normal. The left ventricular internal cavity size was normal in size. There is no left ventricular hypertrophy. Left ventricular diastolic parameters were normal. Right Ventricle: The right ventricular size is normal. No increase in right ventricular wall thickness. Right ventricular systolic function is normal. Left Atrium: Left atrial size was normal in size. Right Atrium: Right atrial size was normal in size. Pericardium: There is no evidence of pericardial effusion. Mitral Valve: The mitral valve is normal in structure. No evidence of mitral valve regurgitation. No evidence of mitral valve stenosis. Tricuspid Valve: The tricuspid valve is normal in structure. Tricuspid valve regurgitation is  not demonstrated. No evidence of tricuspid stenosis. Aortic Valve: The aortic valve is normal in structure. Aortic valve regurgitation is not visualized. No aortic stenosis is present. Pulmonic Valve: The pulmonic valve was normal in structure. Pulmonic valve regurgitation is not visualized. No evidence of pulmonic stenosis. Aorta: The aortic root is  normal in size and structure. Venous: The inferior vena cava is normal in size with greater than 50% respiratory variability, suggesting right atrial pressure of 3 mmHg. IAS/Shunts: No atrial level shunt detected by color flow Doppler.  LEFT VENTRICLE PLAX 2D LVIDd:         5.00 cm         Diastology LVIDs:         3.60 cm         LV e' medial:    11.10 cm/s LV PW:         1.00 cm         LV E/e' medial:  6.3 LV IVS:        0.80 cm         LV e' lateral:   13.30 cm/s LVOT diam:     2.20 cm         LV E/e' lateral: 5.3 LV SV:         89 LV SV Index:   44              2D LVOT Area:     3.80 cm        Longitudinal                                Strain                                2D Strain GLS  -22.2 % LV Volumes (MOD)               Avg: LV vol d, MOD    130.0 ml A2C:                           3D Volume EF LV vol d, MOD    108.0 ml      LV 3D EF:    Left A4C:                                        ventricul LV vol s, MOD    49.7 ml                    ar A2C:                                        ejection LV vol s, MOD    56.1 ml                    fraction A4C:                                        by 3D LV SV MOD A2C:   80.3 ml                    volume is LV SV MOD A4C:   108.0 ml                   53 %. LV SV MOD BP:  67.9 ml                                 3D Volume EF:                                3D EF:        53 %                                LV EDV:       144 ml                                LV ESV:       67 ml                                LV SV:        77 ml RIGHT VENTRICLE             IVC RV S prime:     10.10 cm/s  IVC diam: 2.20 cm TAPSE (M-mode):  2.2 cm LEFT ATRIUM             Index        RIGHT ATRIUM           Index LA diam:        3.00 cm 1.48 cm/m   RA Area:     16.80 cm LA Vol (A2C):   16.6 ml 8.19 ml/m   RA Volume:   48.10 ml  23.72 ml/m LA Vol (A4C):   30.5 ml 15.04 ml/m LA Biplane Vol: 23.5 ml 11.59 ml/m  AORTIC VALVE LVOT Vmax:   109.00 cm/s LVOT Vmean:  71.300 cm/s LVOT VTI:    0.235 m  AORTA Ao Root diam: 3.30 cm Ao Asc diam:  3.20 cm MITRAL VALVE MV Area (PHT): 3.37 cm    SHUNTS MV Decel Time: 225 msec    Systemic VTI:  0.24 m MV E velocity: 70.00 cm/s  Systemic Diam: 2.20 cm MV A velocity: 64.80 cm/s MV E/A ratio:  1.08 Mihai Croitoru MD Electronically signed by Thurmon Fair MD Signature Date/Time: 10/24/2023/3:38:37 PM    Final    CT ANGIO HEAD NECK W WO CM Result Date: 10/24/2023 CLINICAL DATA:  Acute neuro deficit, stroke suspected EXAM: CT ANGIOGRAPHY HEAD AND NECK WITH AND WITHOUT CONTRAST TECHNIQUE: Multidetector CT imaging of the head and neck was performed using the standard protocol during bolus administration of intravenous contrast. Multiplanar CT image reconstructions and MIPs were obtained to evaluate the vascular anatomy. Carotid stenosis measurements (when applicable) are obtained utilizing NASCET criteria, using the distal internal carotid diameter as the denominator. RADIATION DOSE REDUCTION: This exam was performed according to the departmental dose-optimization program which includes automated exposure control, adjustment of the mA and/or kV according to patient size and/or use of iterative reconstruction technique. CONTRAST:  75mL OMNIPAQUE IOHEXOL 350 MG/ML SOLN COMPARISON:  10/24/2023 CT head, no prior CTA available FINDINGS: CT HEAD FINDINGS For noncontrast findings, please see same day CT head. CTA NECK FINDINGS Aortic arch: Standard branching. Imaged portion shows no evidence of aneurysm or dissection. No significant stenosis of the major arch vessel origins. Right  carotid system: No evidence of dissection,  occlusion, or hemodynamically significant stenosis (greater than 50%). Left carotid system: No evidence of dissection, occlusion, or hemodynamically significant stenosis (greater than 50%). Vertebral arteries: Evaluation of the distal V2 segments is somewhat limited by beam hardening artifact from the patient's dental hardware. Within this limitation, no evidence of dissection, occlusion, or hemodynamically significant stenosis (greater than 50%). Skeleton: No acute osseous abnormality. Other neck: No acute finding. Upper chest: No focal pulmonary opacity or pleural effusion. Review of the MIP images confirms the above findings CTA HEAD FINDINGS Anterior circulation: Both internal carotid arteries are patent to the termini, without significant stenosis. A1 segments patent. Normal anterior communicating artery. Anterior cerebral arteries are patent to their distal aspects without significant stenosis. No M1 stenosis or occlusion. MCA branches perfused to their distal aspects without significant stenosis. Posterior circulation: Vertebral arteries patent to the vertebrobasilar junction without significant stenosis. Posterior inferior cerebellar arteries patent proximally. Basilar patent to its distal aspect without significant stenosis. Superior cerebellar arteries patent proximally. Patent P1 segments, diminutive on the right. Near fetal origin of the right PCA from the right posterior communicating artery. The left posterior communicating artery is also patent. PCAs perfused to their distal aspects. Venous sinuses: Well opacified. Patent. Anatomic variants: Near fetal origin of the right PCA. No evidence of aneurysm or vascular malformation. Review of the MIP images confirms the above findings IMPRESSION: 1. No intracranial large vessel occlusion or significant stenosis. 2. No hemodynamically significant stenosis in the neck. Electronically Signed   By: Wiliam Ke M.D.   On: 10/24/2023 12:09   MR BRAIN W  CONTRAST Result Date: 10/24/2023 CLINICAL DATA:  Evaluate for abnormal enhancement. EXAM: MRI HEAD WITH CONTRAST TECHNIQUE: Multiplanar, multiecho pulse sequences of the brain and surrounding structures were obtained with intravenous contrast. CONTRAST:  8mL GADAVIST GADOBUTROL 1 MMOL/ML IV SOLN COMPARISON:  Prior brain MRI from earlier the same day. FINDINGS: Brain: Postcontrast imaging of the brain demonstrates no abnormal enhancement. Specifically, previously identified white matter lesion involving the right temporal lobe does not enhance. No other new findings. Vascular: Normal intravascular enhancement seen throughout the brain. Skull and upper cervical spine: No new findings. Sinuses/Orbits: No new findings. Other: None. IMPRESSION: No abnormal intracranial enhancement. Specifically, the previously identified white matter lesion involving the right temporal lobe does not enhance. Electronically Signed   By: Rise Mu M.D.   On: 10/24/2023 03:58   MR MRV HEAD W WO CONTRAST Result Date: 10/24/2023 CLINICAL DATA:  Initial evaluation for neuro deficit, headache, stroke. EXAM: MR VENOGRAM HEAD WITHOUT AND WITH CONTRAST TECHNIQUE: Angiographic images of the intracranial venous structures were acquired using MRV technique without and with intravenous contrast. CONTRAST:  8mL GADAVIST GADOBUTROL 1 MMOL/ML IV SOLN COMPARISON:  Concomitant brain MRI. FINDINGS: Normal flow related signal and enhancement seen throughout the superior sagittal sinus to the torcula. Transverse and sigmoid sinuses are patent as are the jugular bulbs and visualized proximal internal jugular veins. Straight sinus, vein of Galen, internal cerebral veins, and basal veins of Rosenthal are patent. No evidence for dural venous sinus thrombosis. No appreciable cortical vein abnormality. No other visible pathologic enhancement within the brain. IMPRESSION: Normal intracranial MRV. No evidence for dural venous sinus thrombosis.  Electronically Signed   By: Rise Mu M.D.   On: 10/24/2023 03:02   MR BRAIN WO CONTRAST Result Date: 10/24/2023 CLINICAL DATA:  Initial evaluation for headache, neuro deficit, stroke. EXAM: MRI HEAD WITHOUT CONTRAST TECHNIQUE: Multiplanar, multiecho pulse sequences of the  brain and surrounding structures were obtained without intravenous contrast. COMPARISON:  Prior CT from earlier the same day. FINDINGS: Brain: Cerebral volume within normal limits. Single 12 mm focus of FLAIR hyperintensity noted involving the periventricular in right temporal lobe (series 11, image 22), nonspecific. No other focal parenchymal signal abnormality or significant cerebral white matter disease. No evidence for acute or subacute ischemia. Gray-white matter differentiation maintained. No areas of chronic cortical infarction. No acute or chronic intracranial blood products. No mass lesion, midline shift or mass effect. No hydrocephalus or extra-axial fluid collection. Pituitary gland and suprasellar region within normal limits. Vascular: Major intracranial vascular flow voids are maintained. Skull and upper cervical spine: Craniocervical junction within normal limits. Bone marrow signal intensity normal. No scalp soft tissue abnormality. Sinuses/Orbits: Globes orbital soft tissues within normal limits. Paranasal sinuses are clear. Trace right mastoid effusion noted, of doubtful significance. Other: None. IMPRESSION: 1. No acute intracranial abnormality. 2. Single 12 mm focus of FLAIR hyperintensity involving the periventricular right temporal lobe. Finding is nonspecific, with primary differential considerations including a remote white matter insult or focus of demyelination. 3. Otherwise normal brain MRI. Electronically Signed   By: Rise Mu M.D.   On: 10/24/2023 03:01   CT HEAD CODE STROKE WO CONTRAST Result Date: 10/24/2023 CLINICAL DATA:  Code stroke. Initial evaluation for acute neuro deficit, stroke  suspected. EXAM: CT HEAD WITHOUT CONTRAST TECHNIQUE: Contiguous axial images were obtained from the base of the skull through the vertex without intravenous contrast. RADIATION DOSE REDUCTION: This exam was performed according to the departmental dose-optimization program which includes automated exposure control, adjustment of the mA and/or kV according to patient size and/or use of iterative reconstruction technique. COMPARISON:  Prior study from 10/23/2015. FINDINGS: Brain: Cerebral volume within normal limits for patient age. No acute intracranial hemorrhage. No acute large vessel territory infarct. No mass lesion, midline shift, or mass effect. Ventricles are normal in size without hydrocephalus. No extra-axial fluid collection. Vascular: No abnormal hyperdense vessel. Skull: Scalp soft tissues demonstrate no acute abnormality. Calvarium intact. Sinuses/Orbits: Globes and orbital soft tissues within normal limits. Visualized paranasal sinuses are largely clear. No significant mastoid effusion. ASPECTS [facility] Stroke Program Early CT Score) - Ganglionic level infarction (caudate, lentiform nuclei, internal capsule, insula, M1-M3 cortex): 7 - Supraganglionic infarction (M4-M6 cortex): 3 Total score (0-10 with 10 being normal): 10 IMPRESSION: 1. Negative head CT.  No acute intracranial abnormality. 2. ASPECTS is 10. These results were communicated to Dr. Otelia Limes at 12:15 am on 10/24/2023 by text page via the [facility] messaging system. Electronically Signed   By: Rise Mu M.D.   On: 10/24/2023 00:17     TODAY-DAY OF DISCHARGE:  Subjective:   Ariahnna Fasick today has no headache,no chest abdominal pain,no new weakness tingling or numbness, feels much better wants to go home today.   Objective:   Blood pressure 103/71, pulse 70, temperature (!) 97.3 F (36.3 C), temperature source Oral, resp. rate 16, weight 80.7 kg, SpO2 95%. No intake or output data in the 24 hours ending 10/25/23 0847 Filed  Weights   10/24/23 0000 10/24/23 0027  Weight: 92.6 kg 80.7 kg    Exam: Awake Alert, Oriented *3, No new F.N deficits, Normal affect [place].AT,PERRAL Supple Neck,No JVD, No cervical lymphadenopathy appriciated.  Symmetrical Chest wall movement, Good air movement bilaterally, CTAB RRR,No Gallops,Rubs or new Murmurs, No Parasternal Heave +ve B.Sounds, Abd Soft, Non tender, No organomegaly appriciated, No rebound -guarding or rigidity. No Cyanosis, Clubbing or edema, No new  Rash or bruise   PERTINENT RADIOLOGIC STUDIES: ECHOCARDIOGRAM COMPLETE Result Date: 10/24/2023    ECHOCARDIOGRAM REPORT   Patient Name:   LILLAN LUGINBUHL Date of Exam: 10/24/2023 Medical Rec #:  [id number]      Height:       72.0 in Accession #:    [id number]     Weight:       178.0 lb Date of Birth:  1981/09/18      BSA:          2.028 m Patient Age:    42 years       BP:           99/70 mmHg Patient Gender: F              HR:           66 bpm. Exam Location:  Inpatient Procedure: 2D Echo, Cardiac Doppler, Color Doppler, 3D Echo and Strain Analysis Indications:    Stroke  History:        Patient has no prior history of Echocardiogram examinations.                 Signs/Symptoms:Chest Pain, Dyspnea and Shortness of Breath.  Sonographer:    Sheralyn Boatman RDCS Referring Phys: [id number] Marvel Plan IMPRESSIONS  1. Left ventricular ejection fraction, by estimation, is 55 to 60%. Left ventricular ejection fraction by 3D volume is 53 %. The left ventricle has normal function. The left ventricle has no regional wall motion abnormalities. Left ventricular diastolic  parameters were normal. The average left ventricular global longitudinal strain is -22.2 %. The global longitudinal strain is normal.  2. Right ventricular systolic function is normal. The right ventricular size is normal.  3. The mitral valve is normal in structure. No evidence of mitral valve regurgitation. No evidence of mitral stenosis.  4. The aortic valve is normal in structure. Aortic  valve regurgitation is not visualized. No aortic stenosis is present.  5. The inferior vena cava is normal in size with greater than 50% respiratory variability, suggesting right atrial pressure of 3 mmHg. Conclusion(s)/Recommendation(s): No intracardiac source of embolism detected on this transthoracic study. Consider a transesophageal echocardiogram to exclude cardiac source of embolism if clinically indicated. FINDINGS  Left Ventricle: Left ventricular ejection fraction, by estimation, is 55 to 60%. Left ventricular ejection fraction by 3D volume is 53 %. The left ventricle has normal function. The left ventricle has no regional wall motion abnormalities. The average left ventricular global longitudinal strain is -22.2 %. The global longitudinal strain is normal. The left ventricular internal cavity size was normal in size. There is no left ventricular hypertrophy. Left ventricular diastolic parameters were normal. Right Ventricle: The right ventricular size is normal. No increase in right ventricular wall thickness. Right ventricular systolic function is normal. Left Atrium: Left atrial size was normal in size. Right Atrium: Right atrial size was normal in size. Pericardium: There is no evidence of pericardial effusion. Mitral Valve: The mitral valve is normal in structure. No evidence of mitral valve regurgitation. No evidence of mitral valve stenosis. Tricuspid Valve: The tricuspid valve is normal in structure. Tricuspid valve regurgitation is not demonstrated. No evidence of tricuspid stenosis. Aortic Valve: The aortic valve is normal in structure. Aortic valve regurgitation is not visualized. No aortic stenosis is present. Pulmonic Valve: The pulmonic valve was normal in structure. Pulmonic valve regurgitation is not visualized. No evidence of pulmonic stenosis. Aorta: The aortic root is normal in size and structure. Venous: The inferior  vena cava is normal in size with greater than 50% respiratory  variability, suggesting right atrial pressure of 3 mmHg. IAS/Shunts: No atrial level shunt detected by color flow Doppler.  LEFT VENTRICLE PLAX 2D LVIDd:         5.00 cm         Diastology LVIDs:         3.60 cm         LV e' medial:    11.10 cm/s LV PW:         1.00 cm         LV E/e' medial:  6.3 LV IVS:        0.80 cm         LV e' lateral:   13.30 cm/s LVOT diam:     2.20 cm         LV E/e' lateral: 5.3 LV SV:         89 LV SV Index:   44              2D LVOT Area:     3.80 cm        Longitudinal                                Strain                                2D Strain GLS  -22.2 % LV Volumes (MOD)               Avg: LV vol d, MOD    130.0 ml A2C:                           3D Volume EF LV vol d, MOD    108.0 ml      LV 3D EF:    Left A4C:                                        ventricul LV vol s, MOD    49.7 ml                    ar A2C:                                        ejection LV vol s, MOD    56.1 ml                    fraction A4C:                                        by 3D LV SV MOD A2C:   80.3 ml                    volume is LV SV MOD A4C:   108.0 ml                   53 %. LV SV MOD BP:    67.9 ml  3D Volume EF:                                3D EF:        53 %                                LV EDV:       144 ml                                LV ESV:       67 ml                                LV SV:        77 ml RIGHT VENTRICLE             IVC RV S prime:     10.10 cm/s  IVC diam: 2.20 cm TAPSE (M-mode): 2.2 cm LEFT ATRIUM             Index        RIGHT ATRIUM           Index LA diam:        3.00 cm 1.48 cm/m   RA Area:     16.80 cm LA Vol (A2C):   16.6 ml 8.19 ml/m   RA Volume:   48.10 ml  23.72 ml/m LA Vol (A4C):   30.5 ml 15.04 ml/m LA Biplane Vol: 23.5 ml 11.59 ml/m  AORTIC VALVE LVOT Vmax:   109.00 cm/s LVOT Vmean:  71.300 cm/s LVOT VTI:    0.235 m  AORTA Ao Root diam: 3.30 cm Ao Asc diam:  3.20 cm MITRAL VALVE MV Area (PHT): 3.37 cm    SHUNTS MV  Decel Time: 225 msec    Systemic VTI:  0.24 m MV E velocity: 70.00 cm/s  Systemic Diam: 2.20 cm MV A velocity: 64.80 cm/s MV E/A ratio:  1.08 Mihai Croitoru MD Electronically signed by Thurmon Fair MD Signature Date/Time: 10/24/2023/3:38:37 PM    Final    CT ANGIO HEAD NECK W WO CM Result Date: 10/24/2023 CLINICAL DATA:  Acute neuro deficit, stroke suspected EXAM: CT ANGIOGRAPHY HEAD AND NECK WITH AND WITHOUT CONTRAST TECHNIQUE: Multidetector CT imaging of the head and neck was performed using the standard protocol during bolus administration of intravenous contrast. Multiplanar CT image reconstructions and MIPs were obtained to evaluate the vascular anatomy. Carotid stenosis measurements (when applicable) are obtained utilizing NASCET criteria, using the distal internal carotid diameter as the denominator. RADIATION DOSE REDUCTION: This exam was performed according to the departmental dose-optimization program which includes automated exposure control, adjustment of the mA and/or kV according to patient size and/or use of iterative reconstruction technique. CONTRAST:  75mL OMNIPAQUE IOHEXOL 350 MG/ML SOLN COMPARISON:  10/24/2023 CT head, no prior CTA available FINDINGS: CT HEAD FINDINGS For noncontrast findings, please see same day CT head. CTA NECK FINDINGS Aortic arch: Standard branching. Imaged portion shows no evidence of aneurysm or dissection. No significant stenosis of the major arch vessel origins. Right carotid system: No evidence of dissection, occlusion, or hemodynamically significant stenosis (greater than 50%). Left carotid system: No evidence of dissection, occlusion, or hemodynamically significant stenosis (greater than 50%). Vertebral arteries: Evaluation of the  distal V2 segments is somewhat limited by beam hardening artifact from the patient's dental hardware. Within this limitation, no evidence of dissection, occlusion, or hemodynamically significant stenosis (greater than 50%). Skeleton: No  acute osseous abnormality. Other neck: No acute finding. Upper chest: No focal pulmonary opacity or pleural effusion. Review of the MIP images confirms the above findings CTA HEAD FINDINGS Anterior circulation: Both internal carotid arteries are patent to the termini, without significant stenosis. A1 segments patent. Normal anterior communicating artery. Anterior cerebral arteries are patent to their distal aspects without significant stenosis. No M1 stenosis or occlusion. MCA branches perfused to their distal aspects without significant stenosis. Posterior circulation: Vertebral arteries patent to the vertebrobasilar junction without significant stenosis. Posterior inferior cerebellar arteries patent proximally. Basilar patent to its distal aspect without significant stenosis. Superior cerebellar arteries patent proximally. Patent P1 segments, diminutive on the right. Near fetal origin of the right PCA from the right posterior communicating artery. The left posterior communicating artery is also patent. PCAs perfused to their distal aspects. Venous sinuses: Well opacified. Patent. Anatomic variants: Near fetal origin of the right PCA. No evidence of aneurysm or vascular malformation. Review of the MIP images confirms the above findings IMPRESSION: 1. No intracranial large vessel occlusion or significant stenosis. 2. No hemodynamically significant stenosis in the neck. Electronically Signed   By: Wiliam Ke M.D.   On: 10/24/2023 12:09   MR BRAIN W CONTRAST Result Date: 10/24/2023 CLINICAL DATA:  Evaluate for abnormal enhancement. EXAM: MRI HEAD WITH CONTRAST TECHNIQUE: Multiplanar, multiecho pulse sequences of the brain and surrounding structures were obtained with intravenous contrast. CONTRAST:  8mL GADAVIST GADOBUTROL 1 MMOL/ML IV SOLN COMPARISON:  Prior brain MRI from earlier the same day. FINDINGS: Brain: Postcontrast imaging of the brain demonstrates no abnormal enhancement. Specifically, previously  identified white matter lesion involving the right temporal lobe does not enhance. No other new findings. Vascular: Normal intravascular enhancement seen throughout the brain. Skull and upper cervical spine: No new findings. Sinuses/Orbits: No new findings. Other: None. IMPRESSION: No abnormal intracranial enhancement. Specifically, the previously identified white matter lesion involving the right temporal lobe does not enhance. Electronically Signed   By: Rise Mu M.D.   On: 10/24/2023 03:58   MR MRV HEAD W WO CONTRAST Result Date: 10/24/2023 CLINICAL DATA:  Initial evaluation for neuro deficit, headache, stroke. EXAM: MR VENOGRAM HEAD WITHOUT AND WITH CONTRAST TECHNIQUE: Angiographic images of the intracranial venous structures were acquired using MRV technique without and with intravenous contrast. CONTRAST:  8mL GADAVIST GADOBUTROL 1 MMOL/ML IV SOLN COMPARISON:  Concomitant brain MRI. FINDINGS: Normal flow related signal and enhancement seen throughout the superior sagittal sinus to the torcula. Transverse and sigmoid sinuses are patent as are the jugular bulbs and visualized proximal internal jugular veins. Straight sinus, vein of Galen, internal cerebral veins, and basal veins of Rosenthal are patent. No evidence for dural venous sinus thrombosis. No appreciable cortical vein abnormality. No other visible pathologic enhancement within the brain. IMPRESSION: Normal intracranial MRV. No evidence for dural venous sinus thrombosis. Electronically Signed   By: Rise Mu M.D.   On: 10/24/2023 03:02   MR BRAIN WO CONTRAST Result Date: 10/24/2023 CLINICAL DATA:  Initial evaluation for headache, neuro deficit, stroke. EXAM: MRI HEAD WITHOUT CONTRAST TECHNIQUE: Multiplanar, multiecho pulse sequences of the brain and surrounding structures were obtained without intravenous contrast. COMPARISON:  Prior CT from earlier the same day. FINDINGS: Brain: Cerebral volume within normal limits. Single  12 mm focus of FLAIR hyperintensity noted involving  the periventricular in right temporal lobe (series 11, image 22), nonspecific. No other focal parenchymal signal abnormality or significant cerebral white matter disease. No evidence for acute or subacute ischemia. Gray-white matter differentiation maintained. No areas of chronic cortical infarction. No acute or chronic intracranial blood products. No mass lesion, midline shift or mass effect. No hydrocephalus or extra-axial fluid collection. Pituitary gland and suprasellar region within normal limits. Vascular: Major intracranial vascular flow voids are maintained. Skull and upper cervical spine: Craniocervical junction within normal limits. Bone marrow signal intensity normal. No scalp soft tissue abnormality. Sinuses/Orbits: Globes orbital soft tissues within normal limits. Paranasal sinuses are clear. Trace right mastoid effusion noted, of doubtful significance. Other: None. IMPRESSION: 1. No acute intracranial abnormality. 2. Single 12 mm focus of FLAIR hyperintensity involving the periventricular right temporal lobe. Finding is nonspecific, with primary differential considerations including a remote white matter insult or focus of demyelination. 3. Otherwise normal brain MRI. Electronically Signed   By: Rise Mu M.D.   On: 10/24/2023 03:01   CT HEAD CODE STROKE WO CONTRAST Result Date: 10/24/2023 CLINICAL DATA:  Code stroke. Initial evaluation for acute neuro deficit, stroke suspected. EXAM: CT HEAD WITHOUT CONTRAST TECHNIQUE: Contiguous axial images were obtained from the base of the skull through the vertex without intravenous contrast. RADIATION DOSE REDUCTION: This exam was performed according to the departmental dose-optimization program which includes automated exposure control, adjustment of the mA and/or kV according to patient size and/or use of iterative reconstruction technique. COMPARISON:  Prior study from 10/23/2015. FINDINGS:  Brain: Cerebral volume within normal limits for patient age. No acute intracranial hemorrhage. No acute large vessel territory infarct. No mass lesion, midline shift, or mass effect. Ventricles are normal in size without hydrocephalus. No extra-axial fluid collection. Vascular: No abnormal hyperdense vessel. Skull: Scalp soft tissues demonstrate no acute abnormality. Calvarium intact. Sinuses/Orbits: Globes and orbital soft tissues within normal limits. Visualized paranasal sinuses are largely clear. No significant mastoid effusion. ASPECTS [facility] Stroke Program Early CT Score) - Ganglionic level infarction (caudate, lentiform nuclei, internal capsule, insula, M1-M3 cortex): 7 - Supraganglionic infarction (M4-M6 cortex): 3 Total score (0-10 with 10 being normal): 10 IMPRESSION: 1. Negative head CT.  No acute intracranial abnormality. 2. ASPECTS is 10. These results were communicated to Dr. Otelia Limes at 12:15 am on 10/24/2023 by text page via the [facility] messaging system. Electronically Signed   By: Rise Mu M.D.   On: 10/24/2023 00:17     PERTINENT LAB RESULTS: CBC: Recent Labs    10/24/23 0003 10/25/23 0435  WBC 10.2 5.4  HGB 13.5  13.9 11.4*  HCT 41.1  41.0 35.0*  PLT 230 197   CMET CMP     Component Value Date/Time   NA 136 10/25/2023 0435   K 4.1 10/25/2023 0435   CL 109 10/25/2023 0435   CO2 22 10/25/2023 0435   GLUCOSE 101 (H) 10/25/2023 0435   BUN 10 10/25/2023 0435   CREATININE 0.90 10/25/2023 0435   CALCIUM 8.6 (L) 10/25/2023 0435   PROT 6.7 10/24/2023 0003   ALBUMIN 4.1 10/24/2023 0003   AST 39 10/24/2023 0003   ALT 56 (H) 10/24/2023 0003   ALKPHOS 95 10/24/2023 0003   BILITOT 0.5 10/24/2023 0003   GFRNONAA >60 10/25/2023 0435    GFR CrCl cannot be calculated (Unknown ideal weight.). No results for input(s): "LIPASE", "AMYLASE" in the last 72 hours. No results for input(s): "CKTOTAL", "CKMB", "CKMBINDEX", "TROPONINI" in the last 72 hours. Invalid input(s):  "POCBNP" No results  for input(s): "DDIMER" in the last 72 hours. Recent Labs    10/24/23 0002  HGBA1C 5.5   Recent Labs    10/24/23 0002  CHOL 223*  HDL 46  LDLCALC 131*  TRIG 231*  CHOLHDL 4.8   Recent Labs    10/24/23 0002  TSH 5.197*   No results for input(s): "VITAMINB12", "FOLATE", "FERRITIN", "TIBC", "IRON", "RETICCTPCT" in the last 72 hours. Coags: Recent Labs    10/24/23 0003  INR 1.0   Microbiology: No results found for this or any previous visit (from the past 240 hours).  FURTHER DISCHARGE INSTRUCTIONS:  Get Medicines reviewed and adjusted: Please take all your medications with you for your next visit with your Primary MD  Laboratory/radiological data: Please request your Primary MD to go over all hospital tests and procedure/radiological results at the follow up, please ask your Primary MD to get all Hospital records sent to his/her office.  In some cases, they will be blood work, cultures and biopsy results pending at the time of your discharge. Please request that your primary care M.D. goes through all the records of your hospital data and follows up on these results.  Also Note the following: If you experience worsening of your admission symptoms, develop shortness of breath, life threatening emergency, suicidal or homicidal thoughts you must seek medical attention immediately by calling 911 or calling your MD immediately  if symptoms less severe.  You must read complete instructions/literature along with all the possible adverse reactions/side effects for all the Medicines you take and that have been prescribed to you. Take any new Medicines after you have completely understood and accpet all the possible adverse reactions/side effects.   Do not drive when taking Pain medications or sleeping medications (Benzodaizepines)  Do not take more than prescribed Pain, Sleep and Anxiety Medications. It is not advisable to combine anxiety,sleep and pain  medications without talking with your primary care practitioner  Special Instructions: If you have smoked or chewed Tobacco  in the last 2 yrs please stop smoking, stop any regular Alcohol  and or any Recreational drug use.  Wear Seat belts while driving.  Please note: You were cared for by a hospitalist during your hospital stay. Once you are discharged, your primary care physician will handle any further medical issues. Please note that NO REFILLS for any discharge medications will be authorized once you are discharged, as it is imperative that you return to your primary care physician (or establish a relationship with a primary care physician if you do not have one) for your post hospital discharge needs so that they can reassess your need for medications and monitor your lab values.  Total Time spent coordinating discharge including counseling, education and face to face time equals greater than 30 minutes.  SignedJeoffrey Massed 10/25/2023 8:47 AM

## 2024-02-21 ENCOUNTER — Other Ambulatory Visit: Payer: Self-pay

## 2024-02-21 ENCOUNTER — Emergency Department (HOSPITAL_BASED_OUTPATIENT_CLINIC_OR_DEPARTMENT_OTHER)
Admission: EM | Admit: 2024-02-21 | Discharge: 2024-02-22 | Disposition: A | Discharge status: Home / Self Care | Attending: Emergency Medicine | Admitting: Emergency Medicine

## 2024-02-21 ENCOUNTER — Emergency Department (HOSPITAL_BASED_OUTPATIENT_CLINIC_OR_DEPARTMENT_OTHER)

## 2024-02-21 ENCOUNTER — Encounter (HOSPITAL_BASED_OUTPATIENT_CLINIC_OR_DEPARTMENT_OTHER): Payer: Self-pay | Admitting: Emergency Medicine

## 2024-02-21 DIAGNOSIS — E873 Alkalosis: Secondary | ICD-10-CM | POA: Insufficient documentation

## 2024-02-21 DIAGNOSIS — J209 Acute bronchitis, unspecified: Secondary | ICD-10-CM | POA: Insufficient documentation

## 2024-02-21 DIAGNOSIS — R059 Cough, unspecified: Secondary | ICD-10-CM | POA: Diagnosis present

## 2024-02-21 DIAGNOSIS — Z7982 Long term (current) use of aspirin: Secondary | ICD-10-CM | POA: Insufficient documentation

## 2024-02-21 LAB — COMPREHENSIVE METABOLIC PANEL WITH GFR
ALT: 93 U/L — ABNORMAL HIGH (ref 0–44)
AST: 54 U/L — ABNORMAL HIGH (ref 15–41)
Albumin: 4.6 g/dL (ref 3.5–5.0)
Alkaline Phosphatase: 120 U/L (ref 38–126)
Anion gap: 20 — ABNORMAL HIGH (ref 5–15)
BUN: 11 mg/dL (ref 6–20)
CO2: 14 mmol/L — ABNORMAL LOW (ref 22–32)
Calcium: 9.6 mg/dL (ref 8.9–10.3)
Chloride: 102 mmol/L (ref 98–111)
Creatinine, Ser: 0.86 mg/dL (ref 0.44–1.00)
GFR, Estimated: >60 mL/min (ref >60)
Glucose, Bld: 197 mg/dL — ABNORMAL HIGH (ref 70–99)
Potassium: 3.2 mmol/L — ABNORMAL LOW (ref 3.5–5.1)
Sodium: 136 mmol/L (ref 135–145)
Total Bilirubin: 0.2 mg/dL (ref 0.0–1.2)
Total Protein: 7.4 g/dL (ref 6.5–8.1)

## 2024-02-21 LAB — CBC WITH DIFFERENTIAL/PLATELET
Abs Immature Granulocytes: 0.04 10*3/uL (ref 0.00–0.07)
Basophils Absolute: 0 10*3/uL (ref 0.0–0.1)
Basophils Relative: 0 %
Eosinophils Absolute: 0 10*3/uL (ref 0.0–0.5)
Eosinophils Relative: 0 %
HCT: 30.8 % — ABNORMAL LOW (ref 36.0–46.0)
Hemoglobin: 10.5 g/dL — ABNORMAL LOW (ref 12.0–15.0)
Immature Granulocytes: 1 %
Lymphocytes Relative: 9 %
Lymphs Abs: 0.4 10*3/uL — ABNORMAL LOW (ref 0.7–4.0)
MCH: 28.6 pg (ref 26.0–34.0)
MCHC: 34.1 g/dL (ref 30.0–36.0)
MCV: 83.9 fL (ref 80.0–100.0)
Monocytes Absolute: 0.1 10*3/uL (ref 0.1–1.0)
Monocytes Relative: 3 %
Neutro Abs: 3.7 10*3/uL (ref 1.7–7.7)
Neutrophils Relative %: 87 %
Platelets: 175 10*3/uL (ref 150–400)
RBC: 3.67 MIL/uL — ABNORMAL LOW (ref 3.87–5.11)
RDW: 13.3 % (ref 11.5–15.5)
WBC: 4.2 10*3/uL (ref 4.0–10.5)
nRBC: 0 % (ref 0.0–0.2)

## 2024-02-21 LAB — I-STAT VENOUS BLOOD GAS, ED
Acid-base deficit: 8 mmol/L — ABNORMAL HIGH (ref 0.0–2.0)
Bicarbonate: 13.8 mmol/L — ABNORMAL LOW (ref 20.0–28.0)
Calcium, Ion: 1.18 mmol/L (ref 1.15–1.40)
HCT: 40 % (ref 36.0–46.0)
Hemoglobin: 13.6 g/dL (ref 12.0–15.0)
O2 Saturation: 98 %
Patient temperature: 99
Potassium: 3.2 mmol/L — ABNORMAL LOW (ref 3.5–5.1)
Sodium: 139 mmol/L (ref 135–145)
TCO2: 14 mmol/L — ABNORMAL LOW (ref 22–32)
pCO2, Ven: 19.8 mmHg — CL (ref 44–60)
pH, Ven: 7.451 — ABNORMAL HIGH (ref 7.25–7.43)
pO2, Ven: 90 mmHg — ABNORMAL HIGH (ref 32–45)

## 2024-02-21 LAB — RESP PANEL BY RT-PCR (RSV, FLU A&B, COVID)  RVPGX2
Influenza A by PCR: NEGATIVE
Influenza B by PCR: NEGATIVE
Resp Syncytial Virus by PCR: NEGATIVE
SARS Coronavirus 2 by RT PCR: NEGATIVE

## 2024-02-21 LAB — URINALYSIS, W/ REFLEX TO CULTURE (INFECTION SUSPECTED)
Bilirubin Urine: NEGATIVE
Glucose, UA: NEGATIVE mg/dL
Ketones, ur: NEGATIVE mg/dL
Leukocytes,Ua: NEGATIVE
Nitrite: NEGATIVE
Protein, ur: NEGATIVE mg/dL
Specific Gravity, Urine: 1.02 (ref 1.005–1.030)
pH: 6.5 (ref 5.0–8.0)

## 2024-02-21 LAB — D-DIMER, QUANTITATIVE: D-Dimer, Quant: 0.3 ug{FEU}/mL (ref 0.00–0.50)

## 2024-02-21 LAB — PREGNANCY, URINE: Preg Test, Ur: NEGATIVE

## 2024-02-21 MED ORDER — IPRATROPIUM-ALBUTEROL 0.5-2.5 (3) MG/3ML IN SOLN
3.0000 mL | Freq: Once | RESPIRATORY_TRACT | Status: AC
  Administered 2024-02-21: 3 mL via RESPIRATORY_TRACT
  Filled 2024-02-21: qty 3

## 2024-02-21 MED ORDER — MAGNESIUM SULFATE 2 GM/50ML IV SOLN
2.0000 g | INTRAVENOUS | Status: AC
Start: 2024-02-21 — End: 2024-02-22
  Administered 2024-02-21: 2 g via INTRAVENOUS
  Filled 2024-02-21: qty 50

## 2024-02-21 MED ORDER — POTASSIUM CHLORIDE CRYS ER 20 MEQ PO TBCR
40.0000 meq | EXTENDED_RELEASE_TABLET | Freq: Once | ORAL | Status: AC
Start: 2024-02-22 — End: 2024-02-21
  Administered 2024-02-21: 40 meq via ORAL
  Filled 2024-02-21: qty 2

## 2024-02-21 MED ORDER — ALUM & MAG HYDROXIDE-SIMETH 200-200-20 MG/5ML PO SUSP
30.0000 mL | Freq: Once | ORAL | Status: AC
  Administered 2024-02-21: 30 mL via ORAL
  Filled 2024-02-21: qty 30

## 2024-02-21 MED ORDER — FAMOTIDINE IN NACL 20-0.9 MG/50ML-% IV SOLN
20.0000 mg | Freq: Once | INTRAVENOUS | Status: AC
  Administered 2024-02-22: 20 mg via INTRAVENOUS
  Filled 2024-02-21: qty 50

## 2024-02-21 MED ORDER — LIDOCAINE VISCOUS HCL 2 % MT SOLN
15.0000 mL | Freq: Once | OROMUCOSAL | Status: AC
  Administered 2024-02-21: 15 mL via ORAL
  Filled 2024-02-21: qty 15

## 2024-02-21 MED ORDER — ALBUTEROL SULFATE HFA 108 (90 BASE) MCG/ACT IN AERS
2.0000 | INHALATION_SPRAY | Freq: Once | RESPIRATORY_TRACT | Status: AC
  Administered 2024-02-22: 2 via RESPIRATORY_TRACT
  Filled 2024-02-21: qty 6.7

## 2024-02-21 MED ORDER — METHYLPREDNISOLONE SODIUM SUCC 40 MG IJ SOLR: 40.0000 mg | Freq: Once | INTRAMUSCULAR | Status: DC

## 2024-02-21 MED ORDER — LORAZEPAM 2 MG/ML IJ SOLN
1.0000 mg | Freq: Once | INTRAMUSCULAR | Status: AC
  Administered 2024-02-21: 1 mg via INTRAVENOUS
  Filled 2024-02-21: qty 1

## 2024-02-21 MED ORDER — SODIUM CHLORIDE 0.9 % IV BOLUS
1000.0000 mL | Freq: Once | INTRAVENOUS | Status: AC
  Administered 2024-02-21: 1000 mL via INTRAVENOUS

## 2024-02-21 MED ORDER — AEROCHAMBER PLUS FLO-VU MISC
1.0000 | Freq: Once | Status: AC
  Administered 2024-02-22: 1
  Filled 2024-02-21: qty 1

## 2024-02-21 NOTE — ED Triage Notes (Signed)
 Pt dx with bronchitis today; sts she is having coughing fits and c/o [facility]

## 2024-02-21 NOTE — ED Provider Notes (Signed)
 [facility] Provider Note   CSN: [id number] Arrival date & time: 02/21/24  2118     History  Chief Complaint  Patient presents with   Cough    Kristin Jarvis is a 43 y.o. female with a hx of GERD, migraines who presents emergency department with painful cough.  Patient reports painful cough x 3 days after going out of town with some girlfriends to [place].  She also reports worsening in her GERD symptoms.  She was seen at an urgent care and had an outpatient x-ray done earlier today suggestive of viral bronchitis was started on doxycycline she has had 1 dose she is also took 60 mg of prednisone prescribed by urgent care physician.  She was using her child's DuoNeb at home.  She reports paroxysms of severe cough and pain.  Upon arrival patient had a panic attack which resolved after getting IV Ativan .   Cough      Home Medications Prior to Admission medications   Medication Sig Start Date End Date Taking? Authorizing Provider  amitriptyline (ELAVIL) 25 MG tablet Take 25 mg by mouth at bedtime. 12/03/19   [provider]  Ascorbic Acid (VITAMIN C) 500 MG CHEW Chew 500 mg by mouth at bedtime.    [provider]  aspirin  EC 81 MG tablet Take 1 tablet (81 mg total) by mouth daily. Swallow whole. 10/25/23   Ghimire, Estil Heman, MD  Calcium Magnesium  Zinc 333-133-5 MG TABS Take 1 tablet by mouth daily. 11/24/21   [provider]  citalopram (CELEXA) 20 MG tablet Take 30 mg by mouth daily. 12/25/20   [provider]  diphenhydrAMINE  (BENADRYL ) 25 MG tablet Take 50 mg by mouth every 6 (six) hours as needed for sleep or allergies.    [provider]  famotidine  (PEPCID ) 20 MG tablet Take 20 mg by mouth at bedtime. 07/01/16   [provider]  fluticasone (FLONASE) 50 MCG/ACT nasal spray Place 1 spray into both nostrils daily as needed for allergies or rhinitis. 09/04/23   [provider]   ibuprofen  (ADVIL ) 200 MG tablet Take 800 mg by mouth every 6 (six) hours as needed for moderate pain (pain score 4-6).    [provider]  Multiple Vitamins-Minerals (MULTIVITAMIN ADULTS) TABS Take 1 tablet by mouth daily.    [provider]  omeprazole (PRILOSEC) 40 MG capsule Take 40 mg by mouth 2 (two) times daily. 05/09/14   [provider]  ondansetron  (ZOFRAN -ODT) 4 MG disintegrating tablet Take 4 mg by mouth every 8 (eight) hours as needed for nausea or vomiting. 09/04/23   [provider]  polyethylene glycol (MIRALAX  / GLYCOLAX ) packet Take 17 g by mouth daily. 12/14/17   [provider]  promethazine  (PHENERGAN ) 25 MG tablet Take 25 mg by mouth every 8 (eight) hours as needed for nausea or vomiting.    [provider]  Topiramate  ER (TROKENDI  XR) 200 MG CP24 Take 200 mg by mouth daily. 10/04/17   [provider]  UBRELVY  100 MG TABS Take 100 mg by mouth every 2 (two) hours as needed (for migraines).    [provider]      Allergies    Acetaminophen , Pyridium [phenazopyridine hcl], and Sulfonamide derivatives    Review of Systems   Review of Systems  Respiratory:  Positive for cough.     Physical Exam Updated Vital Signs BP 125/79 (BP Location: Left Arm)   Pulse (!) 120  Temp 99 F (37.2 C)   Resp (!) 28   Ht 6' (1.829 m)   Wt 88.9 kg   SpO2 96%   BMI 26.58 kg/m  Physical Exam Vitals and nursing note reviewed.  Constitutional:      General: She is not in acute distress.    Appearance: She is well-developed. She is not diaphoretic.  HENT:     Head: Normocephalic and atraumatic.     Right Ear: External ear normal.     Left Ear: External ear normal.     Nose: Nose normal.     Mouth/Throat:     Mouth: Mucous membranes are moist.  Eyes:     General: No scleral icterus.    Conjunctiva/sclera: Conjunctivae normal.  Cardiovascular:     Rate and Rhythm: Normal rate and regular rhythm.     Heart  sounds: Normal heart sounds. No murmur heard.    No friction rub. No gallop.  Pulmonary:     Effort: Pulmonary effort is normal. No respiratory distress.     Breath sounds: Normal breath sounds. Transmitted upper airway sounds present. No wheezing.     Comments: Barky cough with transmitted upper airway sounds Abdominal:     General: Bowel sounds are normal. There is no distension.     Palpations: Abdomen is soft. There is no mass.     Tenderness: There is no abdominal tenderness. There is no guarding.  Musculoskeletal:     Cervical back: Normal range of motion.  Skin:    General: Skin is warm and dry.  Neurological:     Mental Status: She is alert and oriented to person, place, and time.  Psychiatric:        Behavior: Behavior normal.     ED Results / Procedures / Treatments   Labs (all labs ordered are listed, but only abnormal results are displayed) Labs Reviewed  CBC WITH DIFFERENTIAL/PLATELET - Abnormal; Notable for the following components:      Result Value   RBC 3.67 (*)    Hemoglobin 10.5 (*)    HCT 30.8 (*)    Lymphs Abs 0.4 (*)    All other components within normal limits  I-STAT VENOUS BLOOD GAS, ED - Abnormal; Notable for the following components:   pH, Ven 7.451 (*)    pCO2, Ven 19.8 (*)    pO2, Ven 90 (*)    Bicarbonate 13.8 (*)    TCO2 14 (*)    Acid-base deficit 8.0 (*)    Potassium 3.2 (*)    All other components within normal limits  RESP PANEL BY RT-PCR (RSV, FLU A&B, COVID)  RVPGX2  D-DIMER, QUANTITATIVE  COMPREHENSIVE METABOLIC PANEL WITH GFR  URINALYSIS, W/ REFLEX TO CULTURE (INFECTION SUSPECTED)  PREGNANCY, URINE    EKG EKG Interpretation Date/Time:  Wednesday Feb 21 2024 22:05:25 EDT Ventricular Rate:  100 PR Interval:  157 QRS Duration:  113 QT Interval:  355 QTC Calculation: 458 R Axis:   83  Text Interpretation: Sinus tachycardia Consider left atrial enlargement Incomplete right bundle branch block Confirmed by Hiawatha Lout  [phone]) on 02/21/2024 10:44:22 PM  Radiology DG Chest Port 1 View Result Date: 02/21/2024 CLINICAL DATA:  shob Pt dx with bronchitis today; sts she is having coughing fits and shortness of breath. Lmp=hysterectomy. EXAM: PORTABLE CHEST 1 VIEW COMPARISON:  Chest x-ray 02/21/2019 5:45 p.m. FINDINGS: The heart and mediastinal contours are within normal limits. Slightly increased perihilar interstitial markings. No focal consolidation. No pulmonary edema. No pleural  effusion. No pneumothorax. No acute osseous abnormality.  Right shoulder surgical changes. IMPRESSION: Findings suggestive of viral bronchiolitis versus reactive airway disease. Electronically Signed   By: Morgane  Naveau M.D.   On: 02/21/2024 22:44    Procedures Procedures    Medications Ordered in ED Medications  ipratropium-albuterol  (DUONEB) 0.5-2.5 (3) MG/3ML nebulizer solution 3 mL (has no administration in time range)  magnesium  sulfate IVPB 2 g 50 mL (has no administration in time range)  sodium chloride  0.9 % bolus 1,000 mL (1,000 mLs Intravenous New Bag/Given 02/21/24 2228)  LORazepam  (ATIVAN ) injection 1 mg (1 mg Intravenous Given 02/21/24 2219)    ED Course/ Medical Decision Making/ A&P Clinical Course as of 02/22/24 0016  Wed Feb 21, 2024  2344 I-Stat venous blood gas, ED(!!) Respiratory alkalosis [AH]  2350 Anion gap(!): 20 [AH]  2351 Bicarbonate(!): 13.8 [AH]  2351 D-Dimer, Quant: 0.30 [AH]  Thu Feb 22, 2024  0000 Patient anion gap 20 but there are no ketones in her urine even though she has some elevated glucose I suspect this is more likely due to respiratory loss dehydration and probably a mild lactic acidosis.  She does not have uremia.  He has gotten albuterol  inhalation here [AH]    Clinical Course User Index [AH] Tama Fails, PA-C                                 Medical Decision Making Patient here with cough/. Differential diagnosis for emergent cause of cough includes but is not limited to upper  respiratory infection, lower respiratory infection, allergies, asthma, irritants, foreign body, medications such as ACE inhibitors, reflux, asthma, CHF, lung cancer, interstitial lung disease, psychiatric causes, postnasal drip and postinfectious bronchospasm.  After review of all data points patient appears to have acute bronchitis with bronchospasm. Further she has been having increased GERD.  Given 3 days hx of bronchitis I am recommending that she d/c the doxycycline and make sure to take a daily acid reducer. Patient treated in the ed with douneb and mag. Significantly improved.  PDMP reviewed during this encounter. Will dc with tussionex, albuterol  and spacer.  Amount and/or Complexity of Data Reviewed Independent Historian: spouse Labs: ordered. Decision-making details documented in ED Course.    Details: As per ed course Radiology: ordered and independent interpretation performed.    Details: I personally visualized and interpreted the images using our PACS system. Acute findings include:  Acute bronchitis  ECG/medicine tests: ordered.    Details: Sinus tachycardia rate of 100  Risk OTC drugs. Prescription drug management.           Final Clinical Impression(s) / ED Diagnoses Final diagnoses:  Acute bronchitis with bronchospasm    Rx / DC Orders ED Discharge Orders     None         Tama Fails, PA-C 02/23/24 1115    Mordecai Applebaum, MD 02/28/24 [phone]

## 2024-02-22 MED ORDER — HYDROCOD POLI-CHLORPHE POLI ER 10-8 MG/5ML PO SUER: 5.0000 mL | Freq: Two times a day (BID) | ORAL | 0 refills | Status: AC | PRN

## 2024-02-22 MED ORDER — SODIUM CHLORIDE 0.9 % IV SOLN
INTRAVENOUS | Status: DC | PRN
  Administered 2024-02-22: 100 mL via INTRAVENOUS

## 2024-02-22 NOTE — Discharge Instructions (Addendum)
 Use your inhaler 1-2 puffs every 4 hours for cough . Get help right away if: You have trouble breathing. Your wheezing and coughing do not get better after taking your medicine. You have chest pain. You have trouble speaking more than one-word sentences. These symptoms may be an emergency. Get help right away. Call 911. Do not wait to see if the symptoms will go away. Do not drive yourself to the hospital

## 2024-11-03 ENCOUNTER — Emergency Department (HOSPITAL_BASED_OUTPATIENT_CLINIC_OR_DEPARTMENT_OTHER)

## 2024-11-03 ENCOUNTER — Encounter (HOSPITAL_BASED_OUTPATIENT_CLINIC_OR_DEPARTMENT_OTHER): Payer: Self-pay | Admitting: Emergency Medicine

## 2024-11-03 ENCOUNTER — Emergency Department (HOSPITAL_BASED_OUTPATIENT_CLINIC_OR_DEPARTMENT_OTHER)
Admission: EM | Admit: 2024-11-03 | Discharge: 2024-11-03 | Disposition: A | Discharge status: Home / Self Care | Attending: Emergency Medicine | Admitting: Emergency Medicine

## 2024-11-03 DIAGNOSIS — Z7982 Long term (current) use of aspirin: Secondary | ICD-10-CM | POA: Insufficient documentation

## 2024-11-03 DIAGNOSIS — R748 Abnormal levels of other serum enzymes: Secondary | ICD-10-CM | POA: Diagnosis not present

## 2024-11-03 DIAGNOSIS — R109 Unspecified abdominal pain: Secondary | ICD-10-CM | POA: Diagnosis present

## 2024-11-03 HISTORY — DX: Fatty (change of) liver, not elsewhere classified: K76.0

## 2024-11-03 LAB — URINALYSIS, ROUTINE W REFLEX MICROSCOPIC
Bilirubin Urine: NEGATIVE
Glucose, UA: NEGATIVE mg/dL
Hgb urine dipstick: NEGATIVE
Ketones, ur: NEGATIVE mg/dL
Leukocytes,Ua: NEGATIVE
Nitrite: NEGATIVE
Protein, ur: NEGATIVE mg/dL
Specific Gravity, Urine: 1.01 (ref 1.005–1.030)
pH: 7 (ref 5.0–8.0)

## 2024-11-03 LAB — CBC WITH DIFFERENTIAL/PLATELET
Abs Immature Granulocytes: 0.03 10*3/uL (ref 0.00–0.07)
Basophils Absolute: 0.1 10*3/uL (ref 0.0–0.1)
Basophils Relative: 1 %
Eosinophils Absolute: 0.2 10*3/uL (ref 0.0–0.5)
Eosinophils Relative: 3 %
HCT: 36.3 % (ref 36.0–46.0)
Hemoglobin: 11.5 g/dL — ABNORMAL LOW (ref 12.0–15.0)
Immature Granulocytes: 0 %
Lymphocytes Relative: 44 %
Lymphs Abs: 3 10*3/uL (ref 0.7–4.0)
MCH: 25.1 pg — ABNORMAL LOW (ref 26.0–34.0)
MCHC: 31.7 g/dL (ref 30.0–36.0)
MCV: 79.3 fL — ABNORMAL LOW (ref 80.0–100.0)
Monocytes Absolute: 0.5 10*3/uL (ref 0.1–1.0)
Monocytes Relative: 7 %
Neutro Abs: 3.1 10*3/uL (ref 1.7–7.7)
Neutrophils Relative %: 45 %
Platelets: 284 10*3/uL (ref 150–400)
RBC: 4.58 MIL/uL (ref 3.87–5.11)
RDW: 16 % — ABNORMAL HIGH (ref 11.5–15.5)
WBC: 6.8 10*3/uL (ref 4.0–10.5)
nRBC: 0 % (ref 0.0–0.2)

## 2024-11-03 LAB — COMPREHENSIVE METABOLIC PANEL WITH GFR
ALT: 119 U/L — ABNORMAL HIGH (ref 0–44)
AST: 52 U/L — ABNORMAL HIGH (ref 15–41)
Albumin: 4.6 g/dL (ref 3.5–5.0)
Alkaline Phosphatase: 128 U/L — ABNORMAL HIGH (ref 38–126)
Anion gap: 11 (ref 5–15)
BUN: 15 mg/dL (ref 6–20)
CO2: 24 mmol/L (ref 22–32)
Calcium: 9.1 mg/dL (ref 8.9–10.3)
Chloride: 106 mmol/L (ref 98–111)
Creatinine, Ser: 0.86 mg/dL (ref 0.44–1.00)
GFR, Estimated: >60 mL/min
Glucose, Bld: 110 mg/dL — ABNORMAL HIGH (ref 70–99)
Potassium: 3.7 mmol/L (ref 3.5–5.1)
Sodium: 140 mmol/L (ref 135–145)
Total Bilirubin: <0.2 mg/dL (ref 0.0–1.2)
Total Protein: 7.2 g/dL (ref 6.5–8.1)

## 2024-11-03 LAB — LIPASE, BLOOD: Lipase: 94 U/L — ABNORMAL HIGH (ref 11–51)

## 2024-11-03 MED ORDER — IOHEXOL 300 MG/ML  SOLN
100.0000 mL | Freq: Once | INTRAMUSCULAR | Status: AC | PRN
  Administered 2024-11-03: 100 mL via INTRAVENOUS

## 2024-11-03 MED ORDER — MORPHINE SULFATE (PF) 4 MG/ML IV SOLN
4.0000 mg | Freq: Once | INTRAVENOUS | Status: AC
  Administered 2024-11-03: 4 mg via INTRAVENOUS
  Filled 2024-11-03: qty 1

## 2024-11-03 MED ORDER — SODIUM CHLORIDE 0.9 % IV BOLUS
1000.0000 mL | Freq: Once | INTRAVENOUS | Status: AC
  Administered 2024-11-03: 1000 mL via INTRAVENOUS

## 2024-11-03 MED ORDER — ONDANSETRON HCL 4 MG/2ML IJ SOLN
4.0000 mg | Freq: Once | INTRAMUSCULAR | Status: AC
  Administered 2024-11-03: 4 mg via INTRAVENOUS
  Filled 2024-11-03: qty 2

## 2024-11-03 NOTE — Discharge Instructions (Signed)
 Please follow-up with your family doctor.  Please return for worsening pain fever inability eat or drink.  Max dosing of the NSAIDS listed below please take with food. Take 4 over the counter ibuprofen  tablets 3 times a day or 2 over-the-counter naproxen tablets twice a day for pain.

## 2024-11-03 NOTE — ED Triage Notes (Signed)
 Pt c/o RUQ pain and swelling; hx of non-alcoholic fatty liver; had US  last Fri; can't get in with GI until 2/24; reports pain is increasing d/t pressure from swelling; +nausea and diarrhea
